# Patient Record
Sex: Male | Born: 1995 | Race: Black or African American | Hispanic: No | Marital: Single
Health system: Southern US, Community
[De-identification: ages and names within clinical notes are randomized; demographics above are authoritative.]

## PROBLEM LIST (undated history)

## (undated) DIAGNOSIS — F909 Attention-deficit hyperactivity disorder, unspecified type: Secondary | ICD-10-CM

## (undated) DIAGNOSIS — R51 Headache: Secondary | ICD-10-CM

## (undated) HISTORY — PX: MOUTH SURGERY: SHX715

## (undated) HISTORY — DX: Headache: R51

---
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
## 2016-02-05 ENCOUNTER — Emergency Department (HOSPITAL_COMMUNITY): Payer: Medicaid Other

## 2016-02-05 ENCOUNTER — Encounter (HOSPITAL_COMMUNITY): Payer: Self-pay

## 2016-02-05 ENCOUNTER — Emergency Department (HOSPITAL_COMMUNITY)
Admission: EM | Admit: 2016-02-05 | Discharge: 2016-02-05 | Disposition: A | Discharge status: Home / Self Care | Payer: Medicaid Other | Attending: Emergency Medicine | Admitting: Emergency Medicine

## 2016-02-05 DIAGNOSIS — S59901A Unspecified injury of right elbow, initial encounter: Secondary | ICD-10-CM | POA: Diagnosis present

## 2016-02-05 DIAGNOSIS — F909 Attention-deficit hyperactivity disorder, unspecified type: Secondary | ICD-10-CM | POA: Insufficient documentation

## 2016-02-05 DIAGNOSIS — S53401A Unspecified sprain of right elbow, initial encounter: Secondary | ICD-10-CM

## 2016-02-05 DIAGNOSIS — Z79899 Other long term (current) drug therapy: Secondary | ICD-10-CM | POA: Insufficient documentation

## 2016-02-05 DIAGNOSIS — J45909 Unspecified asthma, uncomplicated: Secondary | ICD-10-CM | POA: Insufficient documentation

## 2016-02-05 DIAGNOSIS — Y929 Unspecified place or not applicable: Secondary | ICD-10-CM | POA: Diagnosis not present

## 2016-02-05 DIAGNOSIS — Y999 Unspecified external cause status: Secondary | ICD-10-CM | POA: Diagnosis not present

## 2016-02-05 DIAGNOSIS — F17200 Nicotine dependence, unspecified, uncomplicated: Secondary | ICD-10-CM | POA: Diagnosis not present

## 2016-02-05 DIAGNOSIS — W19XXXA Unspecified fall, initial encounter: Secondary | ICD-10-CM | POA: Diagnosis not present

## 2016-02-05 DIAGNOSIS — Y9372 Activity, wrestling: Secondary | ICD-10-CM | POA: Insufficient documentation

## 2016-02-05 MED ORDER — IBUPROFEN 400 MG PO TABS: 600.0000 mg | ORAL_TABLET | Freq: Once | ORAL | Status: DC

## 2016-02-05 MED ORDER — IBUPROFEN 400 MG PO TABS: 400.0000 mg | ORAL_TABLET | Freq: Four times a day (QID) | ORAL | 0 refills | Status: DC | PRN

## 2016-02-05 NOTE — ED Notes (Signed)
Declined W/C at D/C and was escorted to lobby by RN. 

## 2016-02-05 NOTE — ED Provider Notes (Signed)
[facility]PT Provider Note   CSN: [id number] Arrival date & time: 02/05/16  1242  By signing my name below, I, [name], attest that this documentation has been prepared under the direction and in the presence of News CorporationSamantha Alix Lahmann PA-C . Electronically Signed: [name], Neurosurgeoncribe. 02/05/16. 2:04 PM.  History   Chief Complaint Chief Complaint  Patient presents with  . Arm Injury   The history is provided by the patient. No language interpreter was used.    HPI Comments: Samuel Farrell is a 20 y.o. male who presents to the Emergency Department complaining of unchanged right elbow pain that began 3 days ago after wrestling with a family member. He states this individual fell on the affected area, causing him to have pain that is worse with certain movements. Pt has not tried taking anything for pain.    Past Medical History:  Diagnosis Date  . ADHD (attention deficit hyperactivity disorder)   . Asthma   . WGNFAOZH(086.5Headache(784.0)     Patient Active Problem List   Diagnosis Date Noted  . 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 Episodic tension type headache 06/30/2013  . Migraine with aura, without mention of intractable migraine without mention of status migrainosus 06/30/2013  . Migraine without aura, without mention of intractable migraine without mention of status migrainosus 06/30/2013  . Attention deficit disorder with hyperactivity(314.01) 06/30/2013    Past Surgical History:  Procedure Laterality Date  . MOUTH SURGERY         Home Medications    Prior to Admission medications   Medication Sig Start Date End Date Taking? Authorizing Provider  albuterol (PROVENTIL HFA;VENTOLIN HFA) 108 (90 BASE) MCG/ACT inhaler Inhale 1-2 puffs into the lungs every 6 (six) hours as needed for wheezing or shortness of breath. 12/19/14   Zadie Rhineonald Wickline, MD  ibuprofen (ADVIL,MOTRIN) 600 MG tablet Take 1 tablet (600 mg total) by mouth every 6 (six) hours  as needed for mild pain or moderate pain. Patient not taking: Reported on 07/28/2015 01/23/15   Lowanda FosterMindy Brewer, NP  methylphenidate (METADATE CD) 30 MG CR capsule Take 30 mg by mouth daily as needed (for concentration).    Historical Provider, MD  mirtazapine (REMERON) 7.5 MG tablet Take 7.5 mg by mouth at bedtime as needed (sleep).    Historical Provider, MD    Family History Family History  Problem Relation Age of Onset  . Migraines Mother 6210  . ADD / ADHD Mother   . Learning disabilities Mother   . ADD / ADHD Father   . Learning disabilities Father   . ADD / ADHD Sister   . Learning disabilities Sister   . Migraines Maternal Grandmother     Adult  . Other Maternal Grandmother     Organ Failure died at 9561  . Pancreatic cancer Maternal Grandfather     Died at 351  . Migraines Paternal Grandfather     Adult Onset  . Dementia Paternal Grandfather     Age at time of death unknown  . Cancer Paternal Grandmother     Age at time of death unknown    Social History Social History  Substance Use Topics  . Smoking status: Light Tobacco Smoker  . Smokeless tobacco: Never Used  . Alcohol use No     Allergies   Penicillins   Review of Systems Review of Systems  Musculoskeletal: Positive for arthralgias. Negative for joint swelling.  Neurological: Negative for weakness and numbness.     Physical Exam  Updated Vital Signs BP 110/65 (BP Location: Right Arm)   Pulse 65   Temp 97.4 F (36.3 C) (Oral)   Resp 14   SpO2 98%   Physical Exam  Constitutional: He is oriented to person, place, and time. He appears well-developed and well-nourished. No distress.  HENT:  Head: Normocephalic and atraumatic.  Eyes: Conjunctivae are normal. Right eye exhibits no discharge. Left eye exhibits no discharge. No scleral icterus.  Cardiovascular: Normal rate.   Pulmonary/Chest: Effort normal.  Musculoskeletal: He exhibits tenderness. He exhibits no deformity.  Right: Tenderness to palpation  over right olecranon, tricep, and distal ulna. Increased pain with extension of elbow. No obvious bony deformity or ecchymosis.   Neurological: He is alert and oriented to person, place, and time. Coordination normal.  Skin: Skin is warm and dry. No rash noted. He is not diaphoretic. No erythema. No pallor.  Psychiatric: He has a normal mood and affect. His behavior is normal.  Nursing note and vitals reviewed.    ED Treatments / Results  DIAGNOSTIC STUDIES: Oxygen Saturation is 98% on RA, normal by my interpretation.    COORDINATION OF CARE: 2:04 PM Discussed treatment plan with pt at bedside and pt agreed to plan.   Labs (all labs ordered are listed, but only abnormal results are displayed) Labs Reviewed - No data to display  EKG  EKG Interpretation None       Radiology No results found.  Procedures Procedures (including critical care time)  Medications Ordered in ED Medications - No data to display   Initial Impression / Assessment and Plan / ED Course  I have reviewed the triage vital signs and the nursing notes.  Pertinent labs & imaging results that were available during my care of the patient were reviewed by me and considered in my medical decision making (see chart for details).  Clinical Course    Patient X-Ray negative for obvious fracture or dislocation.  Pt advised to follow up with orthopedics. Patient given sling while in ED, conservative therapy recommended and discussed. Patient will be discharged home & is agreeable with above plan. Returns precautions discussed. Pt appears safe for discharge.   Final Clinical Impressions(s) / ED Diagnoses   Final diagnoses:  Sprain of right elbow, initial encounter    New Prescriptions New Prescriptions   IBUPROFEN (ADVIL,MOTRIN) 400 MG TABLET    Take 1 tablet (400 mg total) by mouth every 6 (six) hours as needed.   I personally performed the services described in this documentation, which was scribed in my  presence. The recorded information has been reviewed and is accurate.      Lester KinsmanSamantha Tripp [place], PA-C 02/05/16 [id number]    Pricilla LovelessScott Goldston, MD 02/14/16 [id number]

## 2016-02-05 NOTE — Discharge Instructions (Signed)
Apply ice to affected area. Take ibuprofen as needed for pain. Follow up with your primary care provider or orthopedic provider if symptoms do not improve.

## 2016-02-05 NOTE — ED Triage Notes (Signed)
Patient here with right arm/elbow pain after wrestling with family member on Friday. Pain with ROM. No obvious deformity, NAD

## 2016-02-28 ENCOUNTER — Encounter (HOSPITAL_COMMUNITY): Payer: Self-pay | Admitting: Physical Medicine and Rehabilitation

## 2016-02-28 ENCOUNTER — Emergency Department (HOSPITAL_COMMUNITY): Payer: Medicaid Other

## 2016-02-28 ENCOUNTER — Emergency Department (HOSPITAL_COMMUNITY)
Admission: EM | Admit: 2016-02-28 | Discharge: 2016-02-28 | Disposition: A | Discharge status: Home / Self Care | Payer: Medicaid Other | Attending: Physician Assistant | Admitting: Physician Assistant

## 2016-02-28 DIAGNOSIS — F909 Attention-deficit hyperactivity disorder, unspecified type: Secondary | ICD-10-CM | POA: Diagnosis not present

## 2016-02-28 DIAGNOSIS — L02416 Cutaneous abscess of left lower limb: Secondary | ICD-10-CM | POA: Diagnosis not present

## 2016-02-28 DIAGNOSIS — B999 Unspecified infectious disease: Secondary | ICD-10-CM

## 2016-02-28 DIAGNOSIS — M7989 Other specified soft tissue disorders: Secondary | ICD-10-CM | POA: Diagnosis present

## 2016-02-28 DIAGNOSIS — J45909 Unspecified asthma, uncomplicated: Secondary | ICD-10-CM | POA: Diagnosis not present

## 2016-02-28 DIAGNOSIS — F17200 Nicotine dependence, unspecified, uncomplicated: Secondary | ICD-10-CM | POA: Insufficient documentation

## 2016-02-28 DIAGNOSIS — L0291 Cutaneous abscess, unspecified: Secondary | ICD-10-CM

## 2016-02-28 MED ORDER — NAPROXEN 250 MG PO TABS: 250.0000 mg | ORAL_TABLET | Freq: Two times a day (BID) | ORAL | 0 refills | Status: DC

## 2016-02-28 MED ORDER — LIDOCAINE HCL (PF) 1 % IJ SOLN
INTRAMUSCULAR | Status: AC
  Filled 2016-02-28: qty 5

## 2016-02-28 MED ORDER — LIDOCAINE HCL (PF) 1 % IJ SOLN
5.0000 mL | Freq: Once | INTRAMUSCULAR | Status: AC
Start: 2016-02-28 — End: 2016-02-28
  Administered 2016-02-28: 5 mL

## 2016-02-28 MED ORDER — SULFAMETHOXAZOLE-TRIMETHOPRIM 800-160 MG PO TABS: 1.0000 | ORAL_TABLET | Freq: Two times a day (BID) | ORAL | 0 refills | Status: AC

## 2016-02-28 NOTE — ED Provider Notes (Signed)
[facility] Provider Note   CSN: [id number] Arrival date & time: 02/28/16  1125   By signing my name below, I, Freida Busmaniana Omoyeni, attest that this documentation has been prepared under the direction and in the presence of Everlene FarrierWilliam Gayle Martinez, PA-C. Electronically Signed: Freida Busmaniana Omoyeni, Scribe. 02/28/2016. 1:17 PM.   History   Chief Complaint Chief Complaint  Patient presents with  . Wound Check      The history is provided by the patient. No language interpreter was used.    HPI Comments:  Samuel RushingKhalil Wieland is a 20 y.o. male who presents to the Emergency Department for a wound check. Pt states he noticed a localized area of swelling  below the left knee 1 week ago. He states he popped it and it drained purulent fluid and has remained open and slightly draining for a few days. He reports associated mild-moderate pain to the site.  He denies pain with ROM of his knee. He denies pain to his knee joint. He denies fever; numbness, tingling, and weakness.    Past Medical History:  Diagnosis Date  . ADHD (attention deficit hyperactivity disorder)   . Asthma   . FAOZHYQM(578.4Headache(784.0)     Patient Active Problem List   Diagnosis Date Noted  . 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 Episodic tension type headache 06/30/2013  . Migraine with aura, without mention of intractable migraine without mention of status migrainosus 06/30/2013  . Migraine without aura, without mention of intractable migraine without mention of status migrainosus 06/30/2013  . Attention deficit disorder with hyperactivity(314.01) 06/30/2013    Past Surgical History:  Procedure Laterality Date  . MOUTH SURGERY         Home Medications    Prior to Admission medications   Medication Sig Start Date End Date Taking? Authorizing Provider  albuterol (PROVENTIL HFA;VENTOLIN HFA) 108 (90 BASE) MCG/ACT inhaler Inhale 1-2 puffs into the lungs every 6 (six) hours as needed for wheezing or  shortness of breath. 12/19/14   Zadie Rhineonald Wickline, MD  ibuprofen (ADVIL,MOTRIN) 400 MG tablet Take 1 tablet (400 mg total) by mouth every 6 (six) hours as needed. 02/05/16   Samantha Tripp Dowless, PA-C  ibuprofen (ADVIL,MOTRIN) 600 MG tablet Take 1 tablet (600 mg total) by mouth every 6 (six) hours as needed for mild pain or moderate pain. Patient not taking: Reported on 07/28/2015 01/23/15   Lowanda FosterMindy Brewer, NP  methylphenidate (METADATE CD) 30 MG CR capsule Take 30 mg by mouth daily as needed (for concentration).    Historical Provider, MD  mirtazapine (REMERON) 7.5 MG tablet Take 7.5 mg by mouth at bedtime as needed (sleep).    Historical Provider, MD  naproxen (NAPROSYN) 250 MG tablet Take 1 tablet (250 mg total) by mouth 2 (two) times daily with a meal. 02/28/16   Everlene FarrierWilliam Velisa Regnier, PA-C  sulfamethoxazole-trimethoprim (BACTRIM DS,SEPTRA DS) 800-160 MG tablet Take 1 tablet by mouth 2 (two) times daily. 02/28/16 03/06/16  Everlene FarrierWilliam Eula Jaster, PA-C    Family History Family History  Problem Relation Age of Onset  . Migraines Mother 7310  . ADD / ADHD Mother   . Learning disabilities Mother   . ADD / ADHD Father   . Learning disabilities Father   . ADD / ADHD Sister   . Learning disabilities Sister   . Migraines Maternal Grandmother     Adult  . Other Maternal Grandmother     Organ Failure died at 6561  . Pancreatic cancer Maternal Grandfather     Died  at 47  . Migraines Paternal Grandfather     Adult Onset  . Dementia Paternal Grandfather     Age at time of death unknown  . Cancer Paternal Grandmother     Age at time of death unknown    Social History Social History  Substance Use Topics  . Smoking status: Light Tobacco Smoker  . Smokeless tobacco: Never Used  . Alcohol use No     Allergies   Penicillins   Review of Systems Review of Systems  Constitutional: Negative for fever.  Skin: Positive for wound.  Neurological: Negative for weakness and numbness.     Physical  Exam Updated Vital Signs BP 124/68 (BP Location: Right Arm)   Pulse 64   Temp 97.7 F (36.5 C) (Oral)   Resp 18   Ht 5\' 11"  (1.803 m)   Wt 68 kg   SpO2 99%   BMI 20.92 kg/m   Physical Exam  Constitutional: He appears well-developed and well-nourished. No distress.  Nontoxic appearing.  HENT:  Head: Normocephalic and atraumatic.  Eyes: Right eye exhibits no discharge. Left eye exhibits no discharge.  Cardiovascular: Normal rate, regular rhythm and intact distal pulses.   Bilateral DP and PT pulses intact   Pulmonary/Chest: Effort normal. No respiratory distress.  Musculoskeletal: Normal range of motion. He exhibits no edema or deformity.  Good ROM of his left knee without pain or difficulty. No right knee joint effusion.   Neurological: He is alert. No sensory deficit. Coordination normal.  Skin: Skin is warm and dry. Capillary refill takes less than 2 seconds. No rash noted. He is not diaphoretic. There is erythema. No pallor.  Small 3cm erythematous area of induration with purulent drainage distal to left knee joint. There is overlying erythema, but no surrounding erythema.   Psychiatric: He has a normal mood and affect. His behavior is normal.  Nursing note and vitals reviewed.    ED Treatments / Results  DIAGNOSTIC STUDIES:  Oxygen Saturation is 99% on RA, normal by my interpretation.    COORDINATION OF CARE:  12:45 PM Discussed treatment plan with pt at bedside and pt agreed to plan.  Labs (all labs ordered are listed, but only abnormal results are displayed) Labs Reviewed - No data to display  EKG  EKG Interpretation None       Radiology Dg Knee 1-2 Views Left  Result Date: 02/28/2016 CLINICAL DATA:  Skin sore and pain involving the left tibial tuberosity for the past week. EXAM: LEFT KNEE - 1-2 VIEW COMPARISON:  None. FINDINGS: No fracture or dislocation. Joint spaces are preserved. No evidence of chondrocalcinosis. No joint effusion. Regional soft  tissues appear normal with special attention paid to the tibia tuberosity. No radiopaque foreign body. No subcutaneous emphysema. IMPRESSION: No radiographic correlate for patient's area of concern. Electronically Signed   By: Simonne Come M.D.   On: 02/28/2016 12:24    Procedures .Marland KitchenIncision and Drainage Date/Time: 02/28/2016 12:55 PM Performed by: Everlene Farrier Authorized by: Everlene Farrier   Consent:    Consent obtained:  Verbal   Consent given by:  Patient   Risks discussed:  Incomplete drainage, pain and infection Location:    Type:  Abscess   Location:  Lower extremity   Lower extremity location:  Leg   Leg location:  L lower leg Pre-procedure details:    Skin preparation:  Betadine Anesthesia (see MAR for exact dosages):    Anesthesia method:  Local infiltration   Local anesthetic:  Lidocaine 1%  WITH epi Procedure type:    Complexity:  Simple Procedure details:    Incision types:  Single straight   Incision depth:  Dermal   Scalpel blade:  11   Wound management:  Irrigated with saline and probed and deloculated   Drainage:  Bloody and purulent   Drainage amount:  Scant   Wound treatment:  Wound left open   Packing materials:  None Post-procedure details:    Patient tolerance of procedure:  Tolerated well, no immediate complications    (including critical care time)  EMERGENCY DEPARTMENT US SOFT TISSUE INTERPRETATION "Study: Limited Ultrasound of the noted body part in comments below"  INDICATIONS: Soft tissue infection Multiple views of the body part are obtained with a multi-frequency linear probe  PERFORMED BY:  Myself Everlene Farrier, PA-C  IMAGES ARCHIVED?: Yes  BODY PART:Lower extremity  FINDINGS: Abcess present  INTERPRETATION:  Abcess present  COMMENT: small abscess.      Medications Ordered in ED Medications  lidocaine (PF) (XYLOCAINE) 1 % injection (not administered)  lidocaine (PF) (XYLOCAINE) 1 % injection 5 mL (5 mLs Infiltration  Given 02/28/16 1316)     Initial Impression / Assessment and Plan / ED Course  I have reviewed the triage vital signs and the nursing notes.  Pertinent labs & imaging results that were available during my care of the patient were reviewed by me and considered in my medical decision making (see chart for details).  Clinical Course      This is a 20 y.o. male who presents to the Emergency Department for a wound check. Pt states he noticed a localized area of swelling  below the left knee 1 week ago. He states he popped it and it drained purulent fluid and has remained open and slightly draining for a few days. He reports associated mild-moderate pain to the site.  He denies pain with ROM of his knee. He denies pain to his knee joint. He denies fevers.  On exam the patient is afebrile and nontoxic appearing. His a small area of induration and erythema distal to his left knee joint. No left knee joint effusion or pain with range of motion. There is some slight purulent drainage from the site. There is overlying erythema without surrounding erythema. X-ray obtained in triage is unremarkable.  Patient with skin abscess on exam and ultrasound.  Incision and drainage performed in the ED today.  Abscess was not large enough to warrant packing or drain placement. Supportive care and return precautions discussed.  Pt sent home with Bactrim as a precaution, as it is close to his knee joint.  I discussed strict and specific return precautions. I advised the patient to follow-up with their primary care provider this week. I advised the patient to return to the emergency department with new or worsening symptoms or new concerns. The patient verbalized understanding and agreement with plan.      Final Clinical Impressions(s) / ED Diagnoses   Final diagnoses:  Abscess    New Prescriptions New Prescriptions   NAPROXEN (NAPROSYN) 250 MG TABLET    Take 1 tablet (250 mg total) by mouth 2 (two) times daily with  a meal.   SULFAMETHOXAZOLE-TRIMETHOPRIM (BACTRIM DS,SEPTRA DS) 800-160 MG TABLET    Take 1 tablet by mouth 2 (two) times daily.   I personally performed the services described in this documentation, which was scribed in my presence. The recorded information has been reviewed and is accurate.       Everlene Farrier, PA-C  02/28/16 1318    Courteney Lyn Mackuen, MD 02/28/16 1542

## 2016-02-28 NOTE — ED Triage Notes (Signed)
Pt reports possible cyst/abscess to L knee. Ongoing for several days. Now reports increased pain this morning.

## 2016-02-28 NOTE — ED Notes (Signed)
Patient transported to X-ray 

## 2016-02-28 NOTE — ED Notes (Addendum)
Bump to left knee since Thursday, just came up , denies injury and pt states it swelled to look like he had second knee cap and he mashed it on Sunday , states it drained a lot  now having pain behind knee cap and both sides of knee, pt is jumpy when I pressed on sides of knee. Pt is able to walk

## 2016-04-03 ENCOUNTER — Encounter (HOSPITAL_COMMUNITY): Payer: Self-pay | Admitting: Emergency Medicine

## 2016-04-03 ENCOUNTER — Emergency Department (HOSPITAL_COMMUNITY)
Admission: EM | Admit: 2016-04-03 | Discharge: 2016-04-03 | Disposition: A | Discharge status: Home / Self Care | Payer: Medicaid Other | Attending: Emergency Medicine | Admitting: Emergency Medicine

## 2016-04-03 DIAGNOSIS — R369 Urethral discharge, unspecified: Secondary | ICD-10-CM | POA: Diagnosis present

## 2016-04-03 DIAGNOSIS — F17200 Nicotine dependence, unspecified, uncomplicated: Secondary | ICD-10-CM | POA: Insufficient documentation

## 2016-04-03 DIAGNOSIS — F909 Attention-deficit hyperactivity disorder, unspecified type: Secondary | ICD-10-CM | POA: Insufficient documentation

## 2016-04-03 DIAGNOSIS — J45909 Unspecified asthma, uncomplicated: Secondary | ICD-10-CM | POA: Insufficient documentation

## 2016-04-03 LAB — URINALYSIS, ROUTINE W REFLEX MICROSCOPIC
Bilirubin Urine: NEGATIVE
GLUCOSE, UA: NEGATIVE mg/dL
Hgb urine dipstick: NEGATIVE
KETONES UR: NEGATIVE mg/dL
Nitrite: NEGATIVE
PH: 7 (ref 5.0–8.0)
Protein, ur: NEGATIVE mg/dL
SPECIFIC GRAVITY, URINE: 1.011 (ref 1.005–1.030)
SQUAMOUS EPITHELIAL / LPF: NONE SEEN

## 2016-04-03 MED ORDER — AZITHROMYCIN 250 MG PO TABS
1000.0000 mg | ORAL_TABLET | Freq: Once | ORAL | Status: AC
  Administered 2016-04-03: 1000 mg via ORAL
  Filled 2016-04-03: qty 4

## 2016-04-03 MED ORDER — ONDANSETRON 4 MG PO TBDP
4.0000 mg | ORAL_TABLET | Freq: Once | ORAL | Status: AC
  Administered 2016-04-03: 4 mg via ORAL
  Filled 2016-04-03: qty 1

## 2016-04-03 MED ORDER — CEFTRIAXONE SODIUM 250 MG IJ SOLR
250.0000 mg | Freq: Once | INTRAMUSCULAR | Status: AC
  Administered 2016-04-03: 250 mg via INTRAMUSCULAR
  Filled 2016-04-03: qty 250

## 2016-04-03 NOTE — ED Provider Notes (Signed)
[facility] Provider Note    By signing my name below, I, Earmon PhoenixJennifer Waddell, attest that this documentation has been prepared under the direction and in the presence of Sharilyn SitesLisa Welcome Fults, PA-C. Electronically Signed: Earmon PhoenixJennifer Waddell, ED Scribe. 04/03/16. 2:50 PM.   History   Chief Complaint Chief Complaint  Patient presents with  . SEXUALLY TRANSMITTED DISEASE  . Penile Discharge    The history is provided by the patient and medical records. No language interpreter was used.    HPI Comments:  Samuel Farrell is a 20 y.o. male who presents to the Emergency Department complaining of yellow penile discharge that he noticed about two weeks ago. He reports having unprotected sex with a new sexual partner recently. He has not done anything for treatment. He denies modifying factors. He denies fever, chills, nausea, vomiting, abdominal pain, dysuria, genital lesions, penile or scrotal pain or swelling. He reports allergies to PCN.  No hx of STD in the past.  Past Medical History:  Diagnosis Date  . ADHD (attention deficit hyperactivity disorder)   . Asthma   . UEAVWUJW(119.1Headache(784.0)     Patient Active Problem List   Diagnosis Date Noted  . 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 Episodic tension type headache 06/30/2013  . Migraine with aura, without mention of intractable migraine without mention of status migrainosus 06/30/2013  . Migraine without aura, without mention of intractable migraine without mention of status migrainosus 06/30/2013  . Attention deficit disorder with hyperactivity(314.01) 06/30/2013    Past Surgical History:  Procedure Laterality Date  . MOUTH SURGERY         Home Medications    Prior to Admission medications   Medication Sig Start Date End Date Taking? Authorizing Provider  albuterol (PROVENTIL HFA;VENTOLIN HFA) 108 (90 BASE) MCG/ACT inhaler Inhale 1-2 puffs into the lungs every 6 (six) hours as needed for wheezing or  shortness of breath. 12/19/14   Zadie Rhineonald Wickline, MD  ibuprofen (ADVIL,MOTRIN) 400 MG tablet Take 1 tablet (400 mg total) by mouth every 6 (six) hours as needed. 02/05/16   Samantha Tripp Dowless, PA-C  ibuprofen (ADVIL,MOTRIN) 600 MG tablet Take 1 tablet (600 mg total) by mouth every 6 (six) hours as needed for mild pain or moderate pain. Patient not taking: Reported on 07/28/2015 01/23/15   Lowanda FosterMindy Brewer, NP  methylphenidate (METADATE CD) 30 MG CR capsule Take 30 mg by mouth daily as needed (for concentration).    Historical Provider, MD  mirtazapine (REMERON) 7.5 MG tablet Take 7.5 mg by mouth at bedtime as needed (sleep).    Historical Provider, MD  naproxen (NAPROSYN) 250 MG tablet Take 1 tablet (250 mg total) by mouth 2 (two) times daily with a meal. 02/28/16   Everlene FarrierWilliam Dansie, PA-C    Family History Family History  Problem Relation Age of Onset  . Migraines Mother 6210  . ADD / ADHD Mother   . Learning disabilities Mother   . ADD / ADHD Father   . Learning disabilities Father   . ADD / ADHD Sister   . Learning disabilities Sister   . Migraines Maternal Grandmother     Adult  . Other Maternal Grandmother     Organ Failure died at 2361  . Pancreatic cancer Maternal Grandfather     Died at 7551  . Migraines Paternal Grandfather     Adult Onset  . Dementia Paternal Grandfather     Age at time of death unknown  . Cancer Paternal Grandmother  Age at time of death unknown    Social History Social History  Substance Use Topics  . Smoking status: Light Tobacco Smoker  . Smokeless tobacco: Never Used  . Alcohol use No     Allergies   Penicillins   Review of Systems Review of Systems  Constitutional: Negative for chills and fever.  Gastrointestinal: Negative for abdominal pain, nausea and vomiting.  Genitourinary: Positive for discharge. Negative for dysuria, genital sores, penile pain, penile swelling, scrotal swelling and testicular pain.  All other systems reviewed and are  negative.    Physical Exam Updated Vital Signs BP 128/80 (BP Location: Right Arm)   Pulse 80   Temp 98.1 F (36.7 C) (Oral)   Resp 17   Ht 5\' 10"  (1.778 m)   Wt 150 lb (68 kg)   SpO2 100%   BMI 21.52 kg/m   Physical Exam  Constitutional: He is oriented to person, place, and time. He appears well-developed and well-nourished.  HENT:  Head: Normocephalic and atraumatic.  Mouth/Throat: Oropharynx is clear and moist.  Eyes: Conjunctivae and EOM are normal. Pupils are equal, round, and reactive to light.  Neck: Normal range of motion.  Cardiovascular: Normal rate, regular rhythm and normal heart sounds.   Pulmonary/Chest: Effort normal and breath sounds normal.  Abdominal: Soft. Bowel sounds are normal.  Musculoskeletal: Normal range of motion.  Neurological: He is alert and oriented to person, place, and time.  Skin: Skin is warm and dry.  Psychiatric: He has a normal mood and affect.  Nursing note and vitals reviewed.    ED Treatments / Results  DIAGNOSTIC STUDIES: Oxygen Saturation is 100% on RA, normal by my interpretation.   COORDINATION OF CARE: 2:48 PM- Will check and treat for GC/Chlamydia. Pt verbalizes understanding and agrees to plan.  Medications - No data to display  Labs (all labs ordered are listed, but only abnormal results are displayed) Labs Reviewed - No data to display  EKG  EKG Interpretation None       Radiology No results found.  Procedures Procedures (including critical care time)  Medications Ordered in ED Medications - No data to display   Initial Impression / Assessment and Plan / ED Course  I have reviewed the triage vital signs and the nursing notes.  Pertinent labs & imaging results that were available during my care of the patient were reviewed by me and considered in my medical decision making (see chart for details).  Clinical Course    20 year old male here with penile discharge. Reports new sexual partner. No  history of STD in the past. Denies any abdominal or testicle pain. He is afebrile and nontoxic. UA with small leuks, rare bacteria.  Gc/chl pending.  Patient was treated empirically with rocephin and azithromycin.  Advised he will be notified if culture results are positive.  Recommended to follow-up with the health dept for STD screenings in the future.  Discussed plan with patient, he acknowledged understanding and agreed with plan of care.  Return precautions given for new or worsening symptoms.  Final Clinical Impressions(s) / ED Diagnoses   Final diagnoses:  Penile discharge    New Prescriptions New Prescriptions   No medications on file   I personally performed the services described in this documentation, which was scribed in my presence. The recorded information has been reviewed and is accurate.   Garlon HatchetLisa M Addylin Manke, PA-C 04/03/16 1609    Rolland PorterMark James, MD 04/11/16 [phone]

## 2016-04-03 NOTE — ED Triage Notes (Signed)
Pt. Stated, I've had a penile discharge for 2 weeks.

## 2016-04-03 NOTE — Discharge Instructions (Signed)
You have been treated for gonorrhea and chlamydia. We will call you if your culture results are positive. Please follow-up with the health dept for STD checks in the future. Return to the ED for new or worsening symptoms.

## 2016-04-04 LAB — GC/CHLAMYDIA PROBE AMP (~~LOC~~) NOT AT ARMC
CHLAMYDIA, DNA PROBE: POSITIVE — AB
NEISSERIA GONORRHEA: NEGATIVE

## 2016-11-05 ENCOUNTER — Encounter (HOSPITAL_COMMUNITY): Payer: Self-pay | Admitting: Nurse Practitioner

## 2016-11-05 ENCOUNTER — Emergency Department (HOSPITAL_COMMUNITY): Payer: Medicaid Other

## 2016-11-05 ENCOUNTER — Emergency Department (HOSPITAL_COMMUNITY)
Admission: EM | Admit: 2016-11-05 | Discharge: 2016-11-05 | Disposition: A | Discharge status: Home / Self Care | Payer: Medicaid Other | Attending: Emergency Medicine | Admitting: Emergency Medicine

## 2016-11-05 DIAGNOSIS — M25562 Pain in left knee: Secondary | ICD-10-CM | POA: Insufficient documentation

## 2016-11-05 DIAGNOSIS — Z5321 Procedure and treatment not carried out due to patient leaving prior to being seen by health care provider: Secondary | ICD-10-CM | POA: Insufficient documentation

## 2016-11-05 NOTE — ED Notes (Signed)
Called pt x2 to reassess vitals, pt did not answer

## 2016-11-05 NOTE — ED Triage Notes (Signed)
Pt endorses left knee pain x 4 years that is aggravated by activity. Pt sts yesterday his knee gave out while he was walking yesterday. Pt was supposed to go to work today but was concerned about his knee so he wanted to get it checked out. Pt sts he hears a grinding noise in his left knee.

## 2016-11-05 NOTE — ED Notes (Signed)
Called for patient for vitals reassess, no answer.  Will d/c

## 2016-11-05 NOTE — ED Notes (Signed)
Called pt twice no answer.

## 2017-04-30 ENCOUNTER — Emergency Department (HOSPITAL_COMMUNITY): Payer: No Typology Code available for payment source

## 2017-04-30 ENCOUNTER — Other Ambulatory Visit: Payer: Self-pay

## 2017-04-30 ENCOUNTER — Encounter (HOSPITAL_COMMUNITY): Payer: Self-pay | Admitting: Emergency Medicine

## 2017-04-30 ENCOUNTER — Emergency Department (HOSPITAL_COMMUNITY)
Admission: EM | Admit: 2017-04-30 | Discharge: 2017-04-30 | Disposition: A | Discharge status: Home / Self Care | Payer: No Typology Code available for payment source | Attending: Emergency Medicine | Admitting: Emergency Medicine

## 2017-04-30 DIAGNOSIS — Y929 Unspecified place or not applicable: Secondary | ICD-10-CM | POA: Diagnosis not present

## 2017-04-30 DIAGNOSIS — Z79899 Other long term (current) drug therapy: Secondary | ICD-10-CM | POA: Insufficient documentation

## 2017-04-30 DIAGNOSIS — F17200 Nicotine dependence, unspecified, uncomplicated: Secondary | ICD-10-CM | POA: Diagnosis not present

## 2017-04-30 DIAGNOSIS — M25512 Pain in left shoulder: Secondary | ICD-10-CM | POA: Insufficient documentation

## 2017-04-30 DIAGNOSIS — Y939 Activity, unspecified: Secondary | ICD-10-CM | POA: Diagnosis not present

## 2017-04-30 DIAGNOSIS — Y999 Unspecified external cause status: Secondary | ICD-10-CM | POA: Diagnosis not present

## 2017-04-30 DIAGNOSIS — R51 Headache: Secondary | ICD-10-CM | POA: Insufficient documentation

## 2017-04-30 DIAGNOSIS — J45909 Unspecified asthma, uncomplicated: Secondary | ICD-10-CM | POA: Insufficient documentation

## 2017-04-30 MED ORDER — METHOCARBAMOL 500 MG PO TABS: 500.0000 mg | ORAL_TABLET | Freq: Two times a day (BID) | ORAL | 0 refills | Status: DC

## 2017-04-30 MED ORDER — ACETAMINOPHEN 500 MG PO TABS: 500.0000 mg | ORAL_TABLET | Freq: Four times a day (QID) | ORAL | 0 refills | Status: DC | PRN

## 2017-04-30 MED ORDER — IBUPROFEN 600 MG PO TABS: 600.0000 mg | ORAL_TABLET | Freq: Four times a day (QID) | ORAL | 0 refills | Status: DC | PRN

## 2017-04-30 NOTE — ED Notes (Signed)
Ice pack applied.
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
## 2017-11-01 ENCOUNTER — Encounter (HOSPITAL_COMMUNITY): Payer: Self-pay | Admitting: *Deleted

## 2017-11-01 ENCOUNTER — Ambulatory Visit (HOSPITAL_COMMUNITY)
Admission: EM | Admit: 2017-11-01 | Discharge: 2017-11-01 | Disposition: A | Discharge status: Home / Self Care | Payer: Medicaid Other | Attending: Internal Medicine | Admitting: Internal Medicine

## 2017-11-01 ENCOUNTER — Other Ambulatory Visit: Payer: Self-pay

## 2017-11-01 ENCOUNTER — Ambulatory Visit (INDEPENDENT_AMBULATORY_CARE_PROVIDER_SITE_OTHER): Payer: Medicaid Other

## 2017-11-01 DIAGNOSIS — S93402A Sprain of unspecified ligament of left ankle, initial encounter: Secondary | ICD-10-CM | POA: Diagnosis not present

## 2017-11-01 DIAGNOSIS — M25572 Pain in left ankle and joints of left foot: Secondary | ICD-10-CM

## 2017-11-01 DIAGNOSIS — Y9367 Activity, basketball: Secondary | ICD-10-CM

## 2017-11-01 MED ORDER — IBUPROFEN 600 MG PO TABS: 600.0000 mg | ORAL_TABLET | Freq: Four times a day (QID) | ORAL | 0 refills | Status: DC | PRN

## 2017-11-01 NOTE — ED Triage Notes (Addendum)
Per pt he was playing basketball and he fell down hard on his left ankle. Per pt he needs a note for work

## 2017-11-01 NOTE — Discharge Instructions (Signed)
No fractures on x-ray, likely ankle sprain Use anti-inflammatories for pain/swelling. You may take up to 600-800 mg Ibuprofen every 8 hours with food. You may supplement Ibuprofen with Tylenol [phone] mg every 8 hours.  Ice and elevate foot multiple times a day Slowly transition back to weightbearing, ease back into physical activity  Follow-up if symptoms not improving as expected in the next 2 weeks, return if symptoms worsening.

## 2017-11-01 NOTE — ED Provider Notes (Signed)
[facility]    CSN: [id number] Arrival date & time: 11/01/17  1503     History   Chief Complaint Chief Complaint  Patient presents with  . Ankle Pain    HPI Samuel Farrell is a 22 y.o. male history of asthma, ADHD presenting today for evaluation of left ankle injury.  Patient was playing basketball yesterday and he twisted his ankle when landing on someone else's foot.  Since he has had pain with weightbearing and increased swelling.  He has not taken anything for his pain.  HPI  Past Medical History:  Diagnosis Date  . ADHD (attention deficit hyperactivity disorder)   . Asthma   . NFAOZHYQ(657.8Headache(784.0)     Patient Active Problem List   Diagnosis Date Noted  . 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 Episodic tension type headache 06/30/2013  . Migraine with aura, without mention of intractable migraine without mention of status migrainosus 06/30/2013  . Migraine without aura, without mention of intractable migraine without mention of status migrainosus 06/30/2013  . Attention deficit disorder with hyperactivity(314.01) 06/30/2013    Past Surgical History:  Procedure Laterality Date  . MOUTH SURGERY         Home Medications    Prior to Admission medications   Medication Sig Start Date End Date Taking? Authorizing Provider  acetaminophen (TYLENOL) 500 MG tablet Take 1 tablet (500 mg total) by mouth every 6 (six) hours as needed. 04/30/17   Law, Waylan BogaAlexandra M, PA-C  albuterol (PROVENTIL HFA;VENTOLIN HFA) 108 (90 BASE) MCG/ACT inhaler Inhale 1-2 puffs into the lungs every 6 (six) hours as needed for wheezing or shortness of breath. 12/19/14   Zadie RhineWickline, Donald, MD  ibuprofen (ADVIL,MOTRIN) 600 MG tablet Take 1 tablet (600 mg total) by mouth every 6 (six) hours as needed. 11/01/17   Wieters, Hallie C, PA-C  methocarbamol (ROBAXIN) 500 MG tablet Take 1 tablet (500 mg total) by mouth 2 (two) times daily. 04/30/17   Law, Waylan BogaAlexandra M, PA-C    methylphenidate (METADATE CD) 30 MG CR capsule Take 30 mg by mouth daily as needed (for concentration).    [provider]  mirtazapine (REMERON) 7.5 MG tablet Take 7.5 mg by mouth at bedtime as needed (sleep).    [provider]  naproxen (NAPROSYN) 250 MG tablet Take 1 tablet (250 mg total) by mouth 2 (two) times daily with a meal. 02/28/16   Everlene Farrieransie, William, PA-C    Family History Family History  Problem Relation Age of Onset  . Migraines Mother 7710  . ADD / ADHD Mother   . Learning disabilities Mother   . ADD / ADHD Father   . Learning disabilities Father   . ADD / ADHD Sister   . Learning disabilities Sister   . Migraines Maternal Grandmother        Adult  . Other Maternal Grandmother        Organ Failure died at 6361  . Pancreatic cancer Maternal Grandfather        Died at 10051  . Migraines Paternal Grandfather        Adult Onset  . Dementia Paternal Grandfather        Age at time of death unknown  . Cancer Paternal Grandmother        Age at time of death unknown    Social History Social History   Tobacco Use  . Smoking status: Heavy Tobacco Smoker    Packs/day: 0.50  . Smokeless tobacco: Never Used  Substance Use Topics  . Alcohol use: No  . Drug use: Yes    Frequency: 3.0 times per week    Types: Marijuana     Allergies   Penicillins   Review of Systems Review of Systems  Constitutional: Negative for fatigue and fever.  Eyes: Negative for redness, itching and visual disturbance.  Respiratory: Negative for shortness of breath.   Cardiovascular: Negative for chest pain and leg swelling.  Gastrointestinal: Negative for nausea and vomiting.  Musculoskeletal: Positive for arthralgias, gait problem, joint swelling and myalgias.  Skin: Negative for color change, rash and wound.  Neurological: Negative for dizziness, syncope, weakness, light-headedness and headaches.     Physical Exam Triage Vital Signs ED Triage Vitals  Enc Vitals Group      BP 11/01/17 1531 (!) 144/88     Pulse Rate 11/01/17 1531 (!) 55     Resp 11/01/17 1531 16     Temp 11/01/17 1531 98 F (36.7 C)     Temp Source 11/01/17 1531 Oral     SpO2 11/01/17 1531 100 %     Weight --      Height --      Head Circumference --      Peak Flow --      Pain Score 11/01/17 1528 10     Pain Loc --      Pain Edu? --      Excl. in GC? --    No data found.  Updated Vital Signs BP (!) 144/88 (BP Location: Left Arm)   Pulse (!) 55   Temp 98 F (36.7 C) (Oral)   Resp 16   SpO2 100%   Visual Acuity Right Eye Distance:   Left Eye Distance:   Bilateral Distance:    Right Eye Near:   Left Eye Near:    Bilateral Near:     Physical Exam  Constitutional: He is oriented to person, place, and time. He appears well-developed and well-nourished.  No acute distress  HENT:  Head: Normocephalic and atraumatic.  Nose: Nose normal.  Eyes: Conjunctivae are normal.  Neck: Neck supple.  Cardiovascular: Normal rate.  Pulmonary/Chest: Effort normal. No respiratory distress.  Abdominal: He exhibits no distension.  Musculoskeletal: Normal range of motion.  Moderate swelling to lateral malleolus of left ankle, tenderness to palpation overlying lateral malleolus extending to proximal metatarsals, nontender to palpation over medial malleolus.  Dorsalis pedis 2+, cap refill less than 2 seconds.  Neurological: He is alert and oriented to person, place, and time.  Skin: Skin is warm and dry.  Psychiatric: He has a normal mood and affect.  Nursing note and vitals reviewed.    UC Treatments / Results  Labs (all labs ordered are listed, but only abnormal results are displayed) Labs Reviewed - No data to display  EKG None  Radiology Dg Ankle Complete Left  Result Date: 11/01/2017 CLINICAL DATA:  Twisted left ankle playing basketball yesterday. Swelling and tenderness laterally EXAM: LEFT ANKLE COMPLETE - 3+ VIEW COMPARISON:  None. FINDINGS: Mild lateral soft tissue  swelling. No acute bony abnormality. Specifically, no fracture, subluxation, or dislocation. IMPRESSION: Negative. Electronically Signed   By: Charlett Nose M.D.   On: 11/01/2017 16:09    Procedures Procedures (including critical care time)  Medications Ordered in UC Medications - No data to display  Initial Impression / Assessment and Plan / UC Course  I have reviewed the triage vital signs and the nursing notes.  Pertinent labs & imaging results that  were available during my care of the patient were reviewed by me and considered in my medical decision making (see chart for details).    No fracture on x-ray, likely sprain.  Recommend conservative treatment with rest, ice, anti-inflammatories, elevation.  Ankle brace and crutches for support.  Weight-bear as tolerated.Discussed strict return precautions. Patient verbalized understanding and is agreeable with plan.   Final Clinical Impressions(s) / UC Diagnoses   Final diagnoses:  Sprain of left ankle, unspecified ligament, initial encounter     Discharge Instructions     No fractures on x-ray, likely ankle sprain Use anti-inflammatories for pain/swelling. You may take up to 600-800 mg Ibuprofen every 8 hours with food. You may supplement Ibuprofen with Tylenol [phone] mg every 8 hours.  Ice and elevate foot multiple times a day Slowly transition back to weightbearing, ease back into physical activity  Follow-up if symptoms not improving as expected in the next 2 weeks, return if symptoms worsening.    ED Prescriptions    Medication Sig Dispense Auth. Provider   ibuprofen (ADVIL,MOTRIN) 600 MG tablet Take 1 tablet (600 mg total) by mouth every 6 (six) hours as needed. 30 tablet Wieters, Bellmore C, PA-C     Controlled Substance Prescriptions [place] Controlled Substance Registry consulted? Not Applicable   Lew Dawes, [place] 11/01/17 1622

## 2018-02-17 ENCOUNTER — Other Ambulatory Visit: Payer: Self-pay

## 2018-02-17 ENCOUNTER — Emergency Department (HOSPITAL_COMMUNITY)
Admission: EM | Admit: 2018-02-17 | Discharge: 2018-02-17 | Disposition: A | Discharge status: Home / Self Care | Payer: Medicaid Other | Attending: Emergency Medicine | Admitting: Emergency Medicine

## 2018-02-17 ENCOUNTER — Encounter (HOSPITAL_COMMUNITY): Payer: Self-pay | Admitting: *Deleted

## 2018-02-17 DIAGNOSIS — Z79899 Other long term (current) drug therapy: Secondary | ICD-10-CM | POA: Diagnosis not present

## 2018-02-17 DIAGNOSIS — B9789 Other viral agents as the cause of diseases classified elsewhere: Secondary | ICD-10-CM

## 2018-02-17 DIAGNOSIS — F17200 Nicotine dependence, unspecified, uncomplicated: Secondary | ICD-10-CM | POA: Diagnosis not present

## 2018-02-17 DIAGNOSIS — R05 Cough: Secondary | ICD-10-CM | POA: Diagnosis present

## 2018-02-17 DIAGNOSIS — J45909 Unspecified asthma, uncomplicated: Secondary | ICD-10-CM | POA: Insufficient documentation

## 2018-02-17 DIAGNOSIS — J111 Influenza due to unidentified influenza virus with other respiratory manifestations: Secondary | ICD-10-CM | POA: Diagnosis not present

## 2018-02-17 DIAGNOSIS — R69 Illness, unspecified: Secondary | ICD-10-CM

## 2018-02-17 DIAGNOSIS — J069 Acute upper respiratory infection, unspecified: Secondary | ICD-10-CM

## 2018-02-17 MED ORDER — NAPROXEN 500 MG PO TABS: 500.0000 mg | ORAL_TABLET | Freq: Two times a day (BID) | ORAL | 0 refills | Status: DC

## 2018-02-17 MED ORDER — LIDOCAINE VISCOUS HCL 2 % MT SOLN: 15.0000 mL | OROMUCOSAL | 0 refills | Status: DC | PRN

## 2018-02-17 MED ORDER — ALBUTEROL SULFATE HFA 108 (90 BASE) MCG/ACT IN AERS: 1.0000 | INHALATION_SPRAY | Freq: Four times a day (QID) | RESPIRATORY_TRACT | 0 refills | Status: DC | PRN

## 2018-02-17 MED ORDER — FLUTICASONE PROPIONATE 50 MCG/ACT NA SUSP: 1.0000 | Freq: Every day | NASAL | 2 refills | Status: DC

## 2018-02-17 MED ORDER — BENZONATATE 100 MG PO CAPS: 200.0000 mg | ORAL_CAPSULE | Freq: Three times a day (TID) | ORAL | 0 refills | Status: DC

## 2018-02-17 NOTE — ED Provider Notes (Signed)
[facility]   CSN: [id number] Arrival date & time: 02/17/18  1613     History   Chief Complaint Chief Complaint  Patient presents with  . Cough    HPI Samuel Farrell is a 22 y.o. male with a past medical history of asthma, who presents to ED for evaluation of 5-day history of nonproductive cough, rhinorrhea, sinus pain and pressure, chills, sore throat.  Sick contacts including his relatives with similar symptoms over the weekend.  He has been taking TheraFlu, Mucinex with mild improvement in his symptoms.  He did not receive his influenza vaccine this year.  Denies any hemoptysis, fever, abdominal pain or vomiting, shortness of breath, wheezing.  States that his asthma has been stable since childhood however, will sometimes flareup when he has viral illness around this time of year.  HPI  Past Medical History:  Diagnosis Date  . ADHD (attention deficit hyperactivity disorder)   . Asthma   . [id number](478.2Headache(784.0)     Patient Active Problem List   Diagnosis Date Noted  . 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 Episodic tension type headache 06/30/2013  . Migraine with aura, without mention of intractable migraine without mention of status migrainosus 06/30/2013  . Migraine without aura, without mention of intractable migraine without mention of status migrainosus 06/30/2013  . Attention deficit disorder with hyperactivity(314.01) 06/30/2013    Past Surgical History:  Procedure Laterality Date  . MOUTH SURGERY          Home Medications    Prior to Admission medications   Medication Sig Start Date End Date Taking? Authorizing Provider  acetaminophen (TYLENOL) 500 MG tablet Take 1 tablet (500 mg total) by mouth every 6 (six) hours as needed. 04/30/17   Law, Waylan BogaAlexandra M, PA-C  albuterol (PROVENTIL HFA;VENTOLIN HFA) 108 (90 Base) MCG/ACT inhaler Inhale 1-2 puffs into the lungs every 6 (six) hours  as needed for wheezing or shortness of breath. 02/17/18   Quinlan Mcfall, PA-C  benzonatate (TESSALON) 100 MG capsule Take 2 capsules (200 mg total) by mouth every 8 (eight) hours. 02/17/18   Younique Casad, PA-C  fluticasone (FLONASE) 50 MCG/ACT nasal spray Place 1 spray into both nostrils daily. 02/17/18   Ladaija Dimino, PA-C  ibuprofen (ADVIL,MOTRIN) 600 MG tablet Take 1 tablet (600 mg total) by mouth every 6 (six) hours as needed. 11/01/17   Wieters, Hallie C, PA-C  lidocaine (XYLOCAINE) 2 % solution Use as directed 15 mLs in the mouth or throat as needed for mouth pain. 02/17/18   Maurice Fotheringham, PA-C  methocarbamol (ROBAXIN) 500 MG tablet Take 1 tablet (500 mg total) by mouth 2 (two) times daily. 04/30/17   Law, Waylan BogaAlexandra M, PA-C  methylphenidate (METADATE CD) 30 MG CR capsule Take 30 mg by mouth daily as needed (for concentration).    [provider]  mirtazapine (REMERON) 7.5 MG tablet Take 7.5 mg by mouth at bedtime as needed (sleep).    [provider]  naproxen (NAPROSYN) 500 MG tablet Take 1 tablet (500 mg total) by mouth 2 (two) times daily. 02/17/18   Dietrich PatesKhatri, Tyion Boylen, PA-C    Family History Family History  Problem Relation Age of Onset  . Migraines Mother 1510  . ADD / ADHD Mother   . Learning disabilities Mother   . ADD / ADHD Father   . Learning disabilities Father   . ADD / ADHD Sister   . Learning disabilities Sister   . Migraines Maternal Grandmother  Adult  . Other Maternal Grandmother        Organ Failure died at 45  . Pancreatic cancer Maternal Grandfather        Died at 37  . Migraines Paternal Grandfather        Adult Onset  . Dementia Paternal Grandfather        Age at time of death unknown  . Cancer Paternal Grandmother        Age at time of death unknown    Social History Social History   Tobacco Use  . Smoking status: Heavy Tobacco Smoker    Packs/day: 0.50  . Smokeless tobacco: Never Used  Substance Use Topics  . Alcohol use: No  .  Drug use: Yes    Frequency: 3.0 times per week    Types: Marijuana     Allergies   Penicillins   Review of Systems Review of Systems  Constitutional: Negative for chills and fever.  HENT: Positive for rhinorrhea, sinus pain and sore throat.   Respiratory: Positive for cough.      Physical Exam Updated Vital Signs BP (!) 142/90   Pulse 62   Temp (!) 97.5 F (36.4 C) (Oral)   Resp 18   Ht 5\' 11"  (1.803 m)   Wt 65.8 kg   SpO2 100%   BMI 20.22 kg/m   Physical Exam  Constitutional: He appears well-developed and well-nourished. No distress.  HENT:  Head: Normocephalic and atraumatic.  Right Ear: A middle ear effusion is present.  Left Ear: A middle ear effusion is present.  Nose: Right sinus exhibits maxillary sinus tenderness. Left sinus exhibits maxillary sinus tenderness.  Mouth/Throat: Uvula is midline and oropharynx is clear and moist. Tonsils are 1+ on the right. Tonsils are 1+ on the left. No tonsillar exudate.  Patient does not appear to be in acute distress. No trismus or drooling present. No pooling of secretions. Patient is tolerating secretions and is not in respiratory distress. No neck pain or tenderness to palpation of the neck. Full active and passive range of motion of the neck. No evidence of RPA or PTA.  Eyes: Conjunctivae and EOM are normal. No scleral icterus.  Neck: Normal range of motion.  Cardiovascular: Normal rate, regular rhythm and normal heart sounds.  Pulmonary/Chest: Effort normal and breath sounds normal. No respiratory distress.  No wheezing noted.  Neurological: He is alert.  Skin: No rash noted. He is not diaphoretic.  Psychiatric: He has a normal mood and affect.  Nursing note and vitals reviewed.    ED Treatments / Results  Labs (all labs ordered are listed, but only abnormal results are displayed) Labs Reviewed - No data to display  EKG None  Radiology No results found.  Procedures Procedures (including critical care  time)  Medications Ordered in ED Medications - No data to display   Initial Impression / Assessment and Plan / ED Course  I have reviewed the triage vital signs and the nursing notes.  Pertinent labs & imaging results that were available during my care of the patient were reviewed by me and considered in my medical decision making (see chart for details).     22 year old male presents to ED for dry cough, sore throat, rhinorrhea, sinus pain and pressure, for the past 5 days.  Sick contacts including relatives with similar symptoms.  He has tried Mucinex, TheraFlu with improvement in his symptoms.  States that he usually gets sick around this time of year.  Did not receive  his influenza vaccine because he states that "I do not know I was supposed to get it every year."  Denies any shortness of breath, hemoptysis or fever. On exam patient overall well-appearing.  Lungs are clear to auscultation bilaterally with no wheezing or increased work of breathing noted. Strep testing not indicated based on appearance of tonsils,  Centor criteria. Pt is tolerating secretions, not in respiratory distress, no neck pain, no trismus. Presentation not concerning for peritonsillar abscess or spread of infection to deep spaces of the throat; patent airway. Pt will be discharged with supportive treatment for viral URI and pharyngitis.  He is out of the window for Tamiflu administration.  Ibuprofen or Tylenol as needed for pain/fever.   Patient is hemodynamically stable, in NAD, and able to ambulate in the ED. Evaluation does not show pathology that would require ongoing emergent intervention or inpatient treatment. I explained the diagnosis to the patient. Pain has been managed and has no complaints prior to discharge. Patient is comfortable with above plan and is stable for discharge at this time. All questions were answered prior to disposition. Strict return precautions for returning to the ED were discussed.  Encouraged follow up with PCP.    Portions of this note were generated with Scientist, clinical (histocompatibility and immunogenetics). Dictation errors may occur despite best attempts at proofreading.   Final Clinical Impressions(s) / ED Diagnoses   Final diagnoses:  Viral URI with cough  Influenza-like illness    ED Discharge Orders         Ordered    albuterol (PROVENTIL HFA;VENTOLIN HFA) 108 (90 Base) MCG/ACT inhaler  Every 6 hours PRN     02/17/18 1626    fluticasone (FLONASE) 50 MCG/ACT nasal spray  Daily     02/17/18 1626    benzonatate (TESSALON) 100 MG capsule  Every 8 hours     02/17/18 1626    lidocaine (XYLOCAINE) 2 % solution  As needed     02/17/18 1626    naproxen (NAPROSYN) 500 MG tablet  2 times daily     02/17/18 1626           Dietrich Pates, PA-C 02/17/18 1631    Margarita Grizzle, MD 02/17/18 1902

## 2018-02-17 NOTE — Discharge Instructions (Signed)
Albuterol inhaler as needed for wheezing.  Use Flonase as needed for nasal congestion. Take Tessalon Perles as needed for cough. Swish and spit lidocaine solution to help with throat discomfort.  Do not swallow. Take naproxen as needed for body aches and chills. Return to ED for worsening symptoms, chest pain, vomiting or coughing up blood, shortness of breath.

## 2018-02-17 NOTE — ED Triage Notes (Signed)
Pt reports he has had a cough since Friday. Pt  Reports productive cough and sore throat.

## 2018-02-17 NOTE — ED Notes (Signed)
Declined W/C at D/C and was escorted to lobby by RN. 

## 2018-04-22 IMAGING — DX DG ELBOW COMPLETE 3+V*R*
4 series · 4 of 4 positions shown · non-contrast
Comparison: None.

CLINICAL DATA: Pain following fall

EXAM:
RIGHT ELBOW - COMPLETE 3+ VIEW

[elbow ap]
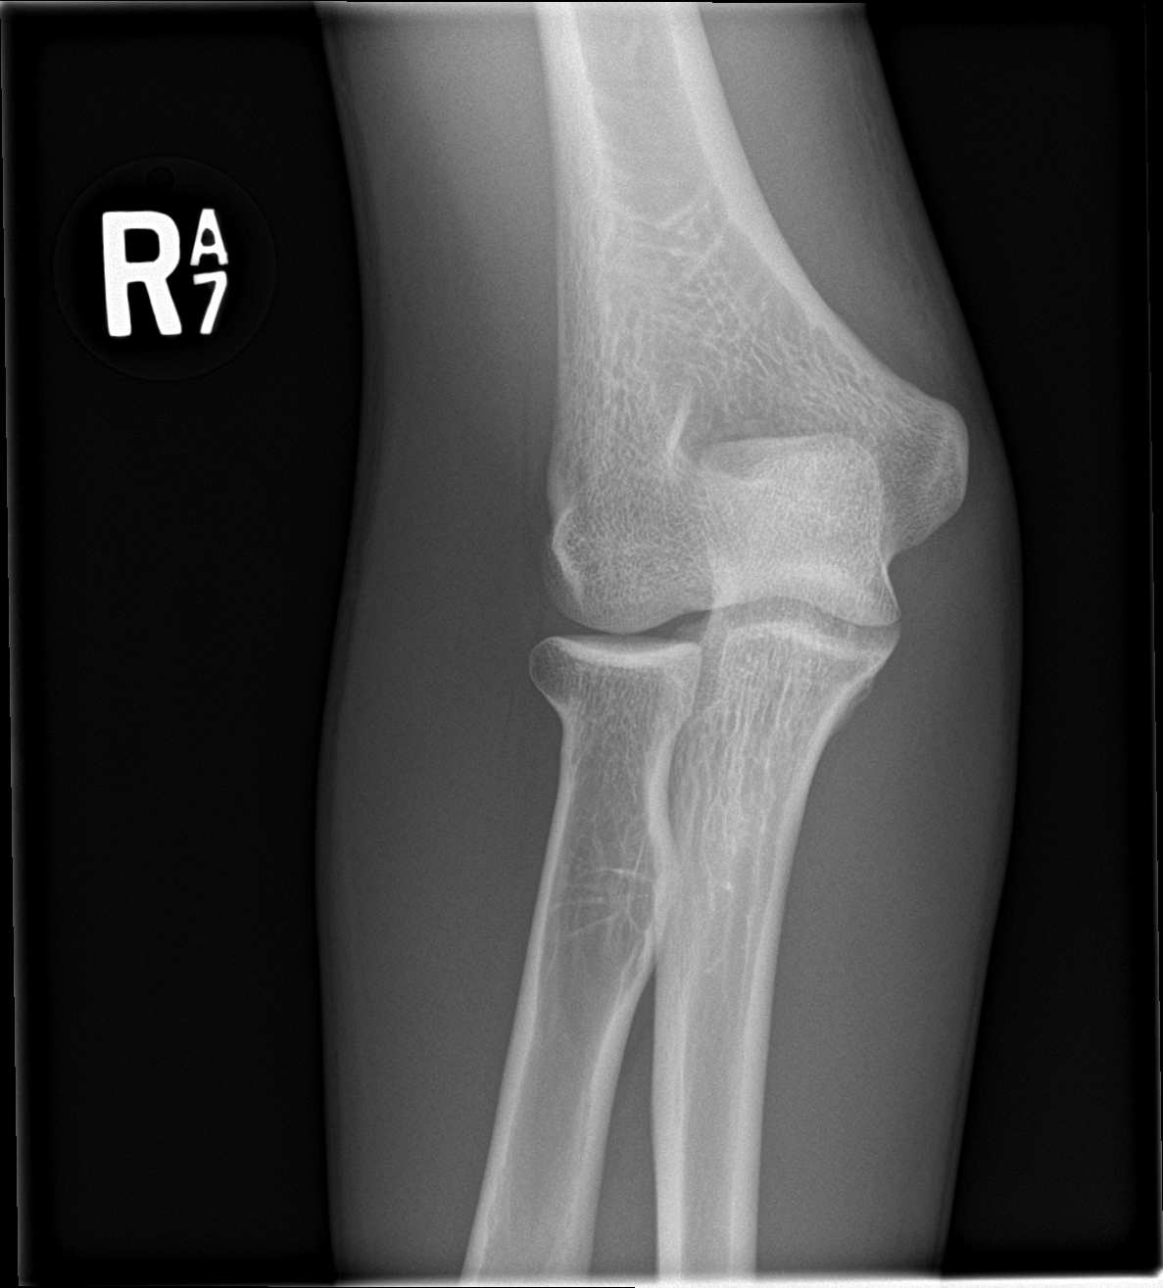

[elbow obl (1 of 2)]
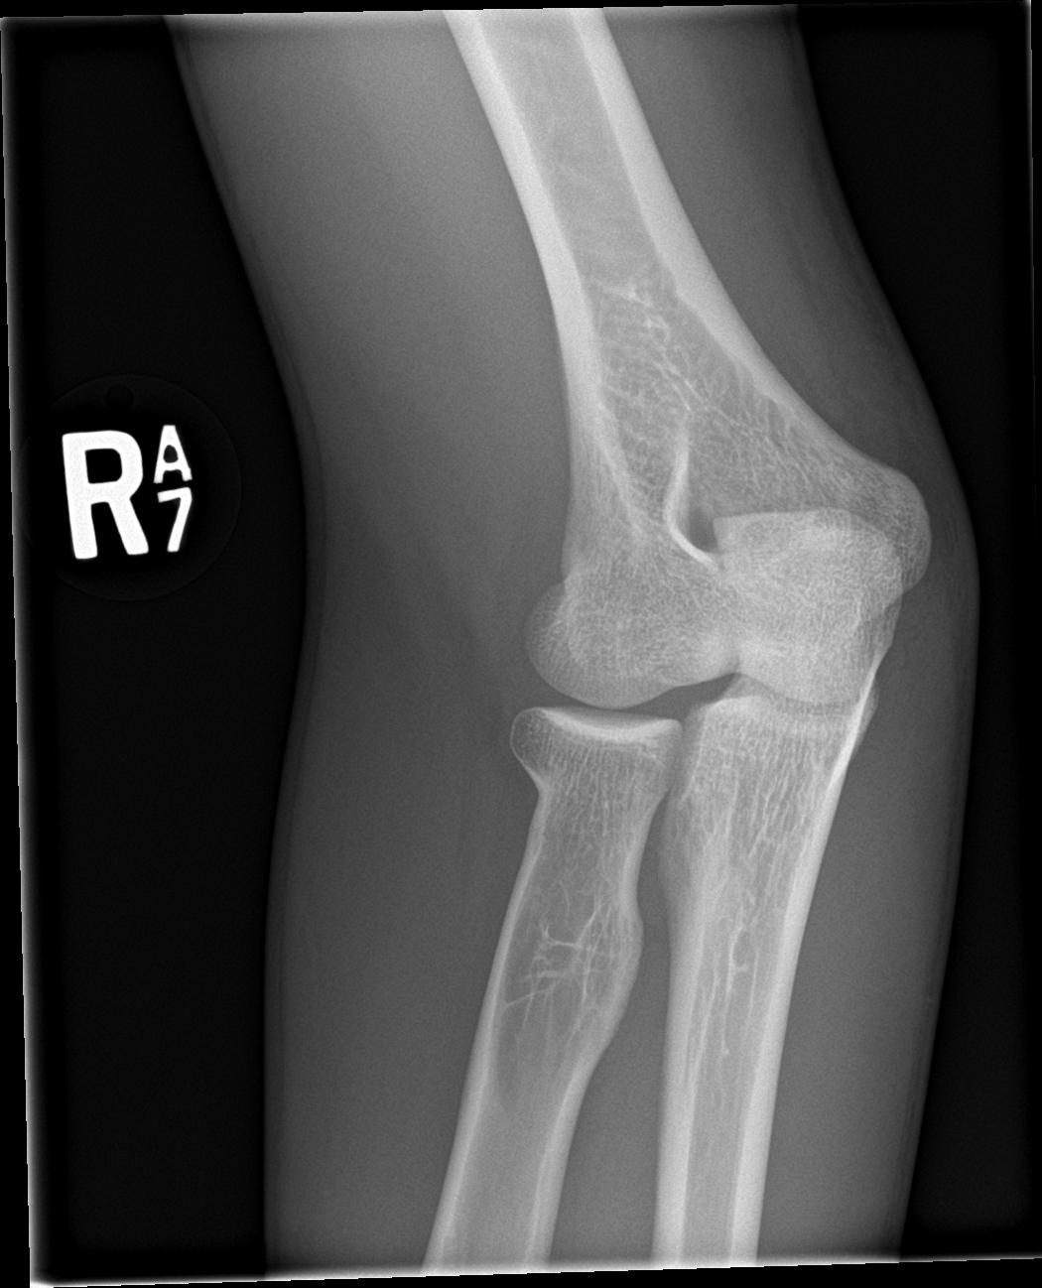

[elbow obl (2 of 2)]
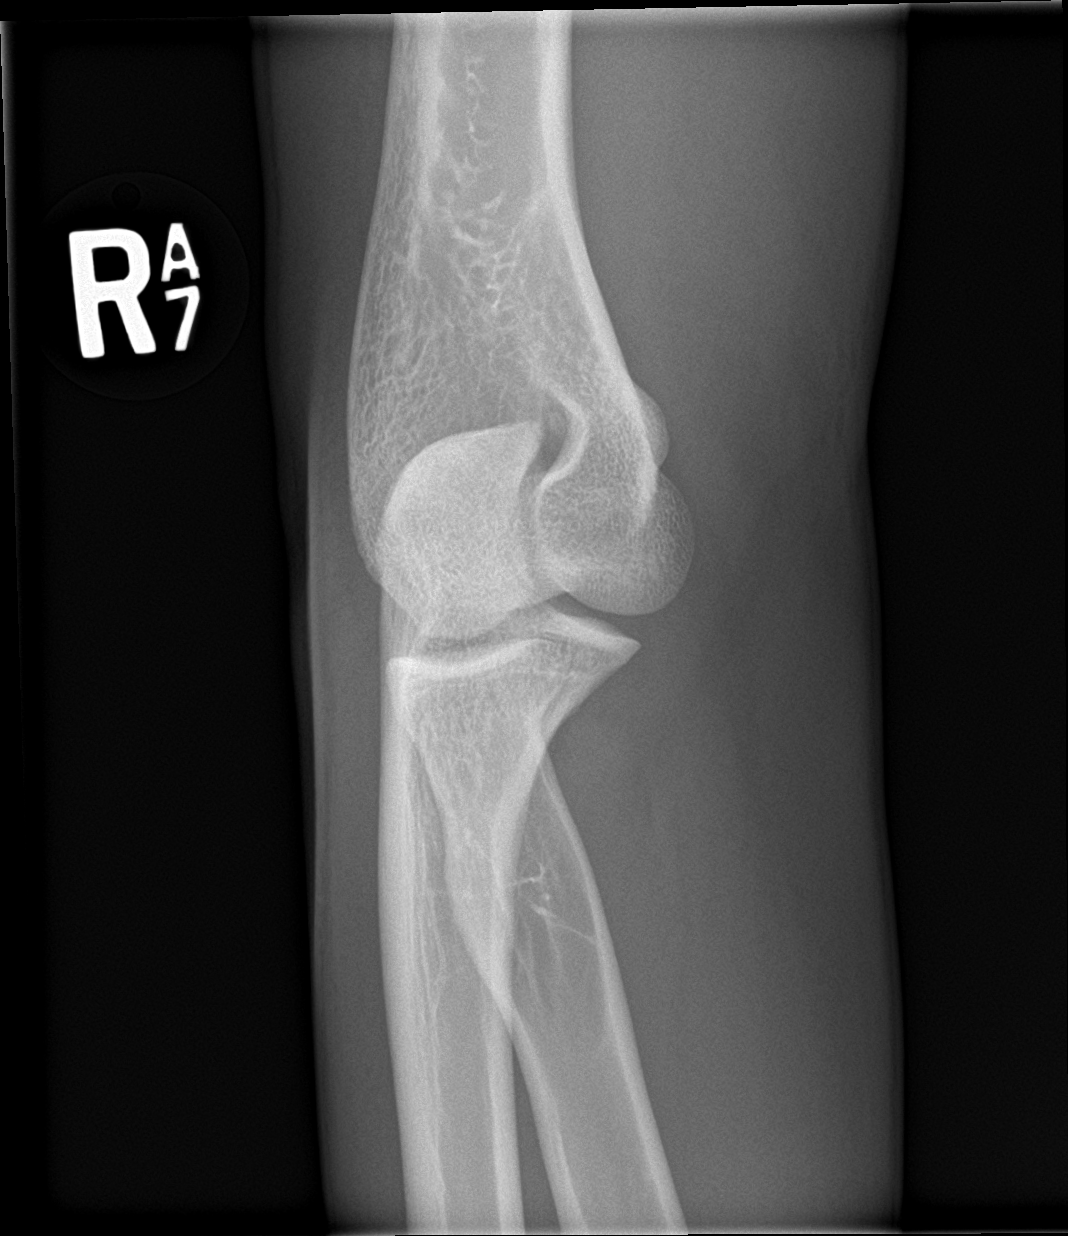

[elbow lat]
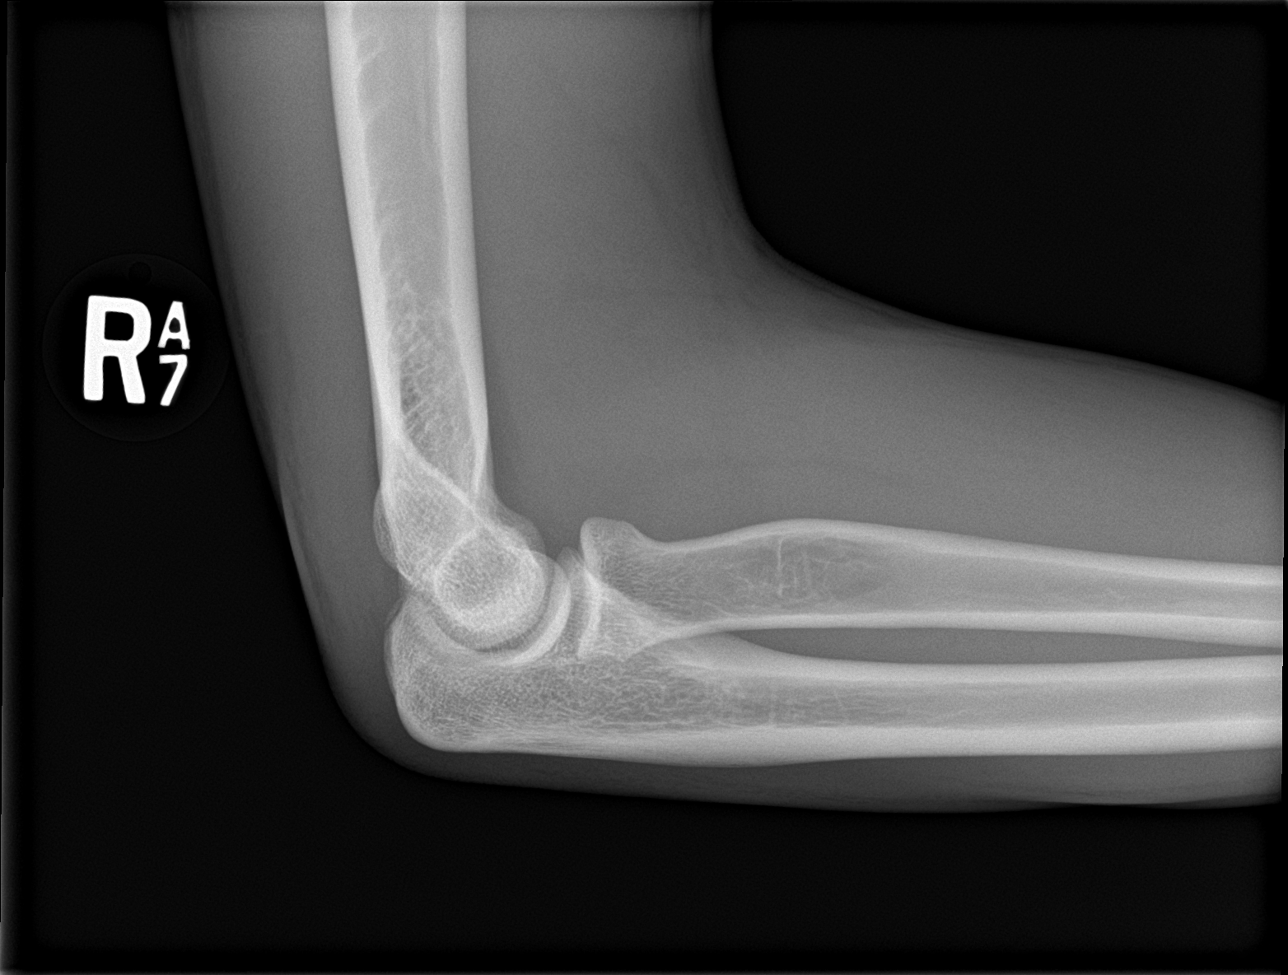

[4 of 4 positions shown; findings below may reference images not displayed]

FINDINGS: Frontal, lateral, and bilateral oblique views were obtained. There
is no fracture or dislocation. No appreciable joint effusion. The
joint spaces appear normal. No erosive change.
IMPRESSION: No fracture or dislocation.  No evident arthropathy.

## 2018-06-09 ENCOUNTER — Emergency Department (HOSPITAL_COMMUNITY)
Admission: EM | Admit: 2018-06-09 | Discharge: 2018-06-09 | Disposition: A | Discharge status: Home / Self Care | Payer: Medicaid Other | Attending: Emergency Medicine | Admitting: Emergency Medicine

## 2018-06-09 ENCOUNTER — Encounter (HOSPITAL_COMMUNITY): Payer: Self-pay | Admitting: Emergency Medicine

## 2018-06-09 ENCOUNTER — Other Ambulatory Visit: Payer: Self-pay

## 2018-06-09 DIAGNOSIS — R52 Pain, unspecified: Secondary | ICD-10-CM | POA: Insufficient documentation

## 2018-06-09 DIAGNOSIS — F17210 Nicotine dependence, cigarettes, uncomplicated: Secondary | ICD-10-CM | POA: Insufficient documentation

## 2018-06-09 DIAGNOSIS — R748 Abnormal levels of other serum enzymes: Secondary | ICD-10-CM | POA: Insufficient documentation

## 2018-06-09 DIAGNOSIS — F909 Attention-deficit hyperactivity disorder, unspecified type: Secondary | ICD-10-CM | POA: Insufficient documentation

## 2018-06-09 DIAGNOSIS — J45909 Unspecified asthma, uncomplicated: Secondary | ICD-10-CM | POA: Insufficient documentation

## 2018-06-09 LAB — BASIC METABOLIC PANEL
ANION GAP: 3 — AB (ref 5–15)
Anion gap: 4 — ABNORMAL LOW (ref 5–15)
BUN: 12 mg/dL (ref 6–20)
BUN: 11 mg/dL (ref 6–20)
CALCIUM: 8.2 mg/dL — AB (ref 8.9–10.3)
CO2: 28 mmol/L (ref 22–32)
CO2: 24 mmol/L (ref 22–32)
Calcium: 9.2 mg/dL (ref 8.9–10.3)
Chloride: 107 mmol/L (ref 98–111)
Chloride: 106 mmol/L (ref 98–111)
Creatinine, Ser: 1.26 mg/dL — ABNORMAL HIGH (ref 0.61–1.24)
Creatinine, Ser: 1.08 mg/dL (ref 0.61–1.24)
GFR calc Af Amer: >60 mL/min (ref >60)
GFR calc non Af Amer: >60 mL/min (ref >60)
Glucose, Bld: 75 mg/dL (ref 70–99)
Glucose, Bld: 80 mg/dL (ref 70–99)
Potassium: 3.9 mmol/L (ref 3.5–5.1)
Potassium: 3.7 mmol/L (ref 3.5–5.1)
Sodium: 138 mmol/L (ref 135–145)
Sodium: 134 mmol/L — ABNORMAL LOW (ref 135–145)

## 2018-06-09 LAB — URINALYSIS, ROUTINE W REFLEX MICROSCOPIC
BILIRUBIN URINE: NEGATIVE
Glucose, UA: NEGATIVE mg/dL
Hgb urine dipstick: NEGATIVE
KETONES UR: NEGATIVE mg/dL
Leukocytes,Ua: NEGATIVE
Nitrite: NEGATIVE
Protein, ur: NEGATIVE mg/dL
Specific Gravity, Urine: 1.012 (ref 1.005–1.030)
pH: 7 (ref 5.0–8.0)

## 2018-06-09 LAB — CBC WITH DIFFERENTIAL/PLATELET
Abs Immature Granulocytes: 0.03 10*3/uL (ref 0.00–0.07)
BASOS ABS: 0.1 10*3/uL (ref 0.0–0.1)
BASOS PCT: 1 %
EOS ABS: 0.3 10*3/uL (ref 0.0–0.5)
EOS PCT: 4 %
HCT: 43.7 % (ref 39.0–52.0)
Hemoglobin: 15 g/dL (ref 13.0–17.0)
Immature Granulocytes: 0 %
LYMPHS PCT: 31 %
Lymphs Abs: 2.8 10*3/uL (ref 0.7–4.0)
MCH: 29.3 pg (ref 26.0–34.0)
MCHC: 34.3 g/dL (ref 30.0–36.0)
MCV: 85.4 fL (ref 80.0–100.0)
Monocytes Absolute: 0.6 10*3/uL (ref 0.1–1.0)
Monocytes Relative: 6 %
NRBC: 0 % (ref 0.0–0.2)
Neutro Abs: 5.4 10*3/uL (ref 1.7–7.7)
Neutrophils Relative %: 58 %
PLATELETS: 178 10*3/uL (ref 150–400)
RBC: 5.12 MIL/uL (ref 4.22–5.81)
RDW: 13 % (ref 11.5–15.5)
WBC: 9.2 10*3/uL (ref 4.0–10.5)

## 2018-06-09 LAB — CK
Total CK: 3043 U/L — ABNORMAL HIGH (ref 49–397)
Total CK: 2496 U/L — ABNORMAL HIGH (ref 49–397)

## 2018-06-09 MED ORDER — ACETAMINOPHEN 325 MG PO TABS
650.0000 mg | ORAL_TABLET | Freq: Once | ORAL | Status: AC
  Administered 2018-06-09: 650 mg via ORAL
  Filled 2018-06-09: qty 2

## 2018-06-09 MED ORDER — SODIUM CHLORIDE 0.9 % IV BOLUS
2000.0000 mL | Freq: Once | INTRAVENOUS | Status: AC
  Administered 2018-06-09: 2000 mL via INTRAVENOUS

## 2018-06-09 NOTE — ED Triage Notes (Signed)
ot reports feeling "sore all over" and that some places are worse than others. Denies any trauma or injury. 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

## 2018-06-09 NOTE — Discharge Instructions (Addendum)
Your CK level was 2,496 you need this level RECHECKED IN 1 WEEK. Please schedule an appointment with your primary care physician or the [facility] Wellness for improvement of your symptoms. YOU NEED TO DRINK PLENTY OF WATER!!!! If you experience worsening symptoms return into the ED.

## 2018-06-09 NOTE — ED Provider Notes (Signed)
  Physical Exam  BP 137/86 (BP Location: Left Arm)   Pulse 70   Temp 98.5 F (36.9 C)   Resp 16   SpO2 100%   Physical Exam  ED Course/Procedures     Procedures  MDM  Patient care received from Dr. Jacqulyn Bath during signout.  Please see his note for a full HPI.  Briefly, patient with generalized body aches after starting a new job.  Gait was found to be 3000 during evaluation.  He was provided with 2 L bolus along with repeat of BMP and CK to see if it was trending down.  Plan was to dispel patient or admitted depending on CK level. 9:36 PM patient reports he is feeling about the same when he first came in, girlfriend the bedside "he ready to go ".  I explained to them the importance of a CK level.  He will need this level rechecked within 1 week.  He understands and agrees, he will return to [facility] health wellness clinic Or emergency department to have level rechecked.  Also provide patient with a note for work on Monday.  He understands and agrees with management.  Return precautions provided at length.       Claude Manges, PA-C 06/09/18 2142    Maia Plan, MD 06/10/18 1044

## 2018-06-09 NOTE — ED Provider Notes (Signed)
Emergency Department Provider Note   I have reviewed the triage vital signs and the nursing notes.   HISTORY  Chief Complaint Generalized Body Aches   HPI Samuel Farrell is a 23 y.o. male with PMH of asthma and ADHD presents to the emergency department for evaluation of diffuse body aches.  Patient describes muscle aching all over worse in his legs and ankles.  Patient states symptoms have started since working his job at [employer].  He states it is a relatively physical job with frequent lifting.  He has not started any new, vigorous workout routine.  He does not take any medications.  He states he drinks alcohol occasionally.  Denies using drugs.  He is not experiencing any specific chest pain or shortness of breath symptoms.  No fevers or chills.  No flulike symptoms.  No international travel.  Past Medical History:  Diagnosis Date  . ADHD (attention deficit hyperactivity disorder)   . Asthma   . EXNTZGYF(749.4)     Patient Active Problem List   Diagnosis Date Noted  . 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 Episodic tension type headache 06/30/2013  . Migraine with aura, without mention of intractable migraine without mention of status migrainosus 06/30/2013  . Migraine without aura, without mention of intractable migraine without mention of status migrainosus 06/30/2013  . Attention deficit disorder with hyperactivity(314.01) 06/30/2013    Past Surgical History:  Procedure Laterality Date  . MOUTH SURGERY     Allergies Penicillins  Family History  Problem Relation Age of Onset  . Migraines Mother 51  . ADD / ADHD Mother   . Learning disabilities Mother   . ADD / ADHD Father   . Learning disabilities Father   . ADD / ADHD Sister   . Learning disabilities Sister   . Migraines Maternal Grandmother        Adult  . Other Maternal Grandmother        Organ Failure died at 71  . Pancreatic cancer Maternal Grandfather        Died at 49    . Migraines Paternal Grandfather        Adult Onset  . Dementia Paternal Grandfather        Age at time of death unknown  . Cancer Paternal Grandmother        Age at time of death unknown    Social History Social History   Tobacco Use  . Smoking status: Heavy Tobacco Smoker    Packs/day: 0.50  . Smokeless tobacco: Never Used  Substance Use Topics  . Alcohol use: No  . Drug use: Yes    Frequency: 3.0 times per week    Types: Marijuana    Review of Systems  Constitutional: No fever/chills Eyes: No visual changes. ENT: No sore throat. Cardiovascular: Denies chest pain. Respiratory: Denies shortness of breath. Gastrointestinal: No abdominal pain.  No nausea, no vomiting.  No diarrhea.  No constipation. Genitourinary: Negative for dysuria. Musculoskeletal: Negative for back pain. Muscle cramping.  Skin: Negative for rash. Neurological: Negative for headaches, focal weakness or numbness.  10-point ROS otherwise negative.  ____________________________________________   PHYSICAL EXAM:  VITAL SIGNS: ED Triage Vitals [06/09/18 1538]  Enc Vitals Group     BP 125/68     Pulse Rate 64     Resp 16     Temp 98.5 F (36.9 C)     Temp src      SpO2 98 %   Constitutional: Alert and  oriented. Well appearing and in no acute distress. Eyes: Conjunctivae are normal.  Head: Atraumatic. Nose: No congestion/rhinnorhea. Mouth/Throat: Mucous membranes are moist.  Neck: No stridor.   Cardiovascular: Normal rate, regular rhythm. Good peripheral circulation. Grossly normal heart sounds.   Respiratory: Normal respiratory effort.  No retractions. Lungs CTAB. Gastrointestinal: Soft and nontender. No distention.  Musculoskeletal: No lower extremity tenderness nor edema. No gross deformities of extremities. Neurologic:  Normal speech and language. No gross focal neurologic deficits are appreciated.  Skin:  Skin is warm, dry and intact. No rash  noted.  ____________________________________________   LABS (all labs ordered are listed, but only abnormal results are displayed)  Labs Reviewed  BASIC METABOLIC PANEL - Abnormal; Notable for the following components:      Result Value   Creatinine, Ser 1.26 (*)    Anion gap 3 (*)    All other components within normal limits  CK - Abnormal; Notable for the following components:   Total CK 3,043 (*)    All other components within normal limits  BASIC METABOLIC PANEL - Abnormal; Notable for the following components:   Sodium 134 (*)    Calcium 8.2 (*)    Anion gap 4 (*)    All other components within normal limits  CK - Abnormal; Notable for the following components:   Total CK 2,496 (*)    All other components within normal limits  CBC WITH DIFFERENTIAL/PLATELET  URINALYSIS, ROUTINE W REFLEX MICROSCOPIC   ____________________________________________  RADIOLOGY  None ____________________________________________   PROCEDURES  Procedure(s) performed:   Procedures  None ____________________________________________   INITIAL IMPRESSION / ASSESSMENT AND PLAN / ED COURSE  Pertinent labs & imaging results that were available during my care of the patient were reviewed by me and considered in my medical decision making (see chart for details).  Patient presents to the emergency department for evaluation of muscle aches diffusely.  He is afebrile.  No rash.  He is ambulatory with some limping and occasional muscle cramping.  No flulike symptoms.  Plan for screening labs to assess patient's electrolytes, kidney function, and reassess.  CK elevated to 3000.  No significant acute kidney injury.  Would not expect renal impairment below 5000.  Plan for 2 L IV fluid, recheck CK/BMP and likely discharge if down-trending with instructions to drink lots of water in the coming days and provide a work note as this is possibly the source of his elevated CK.  Patient is not on  medications which I would expect to cause this.  He denies any drug use.  Patient will require PCP follow-up which I have discussed with him for repeat labs in the coming week.  ____________________________________________  FINAL CLINICAL IMPRESSION(S) / ED DIAGNOSES  Final diagnoses:  Elevated CK  Generalized body aches     MEDICATIONS GIVEN DURING THIS VISIT:  Medications  sodium chloride 0.9 % bolus 2,000 mL (0 mLs Intravenous Stopped 06/09/18 2011)  acetaminophen (TYLENOL) tablet 650 mg (650 mg Oral Given 06/09/18 2056)    Note:  This document was prepared using Dragon voice recognition software and may include unintentional dictation errors.  Alona Bene, MD Emergency Medicine    Jerell Demery, Arlyss Repress, MD 06/10/18 [phone]

## 2018-06-18 ENCOUNTER — Ambulatory Visit
Admission: EM | Admit: 2018-06-18 | Discharge: 2018-06-18 | Disposition: A | Discharge status: Home / Self Care | Payer: Medicaid Other | Attending: Family Medicine | Admitting: Family Medicine

## 2018-06-18 DIAGNOSIS — Z09 Encounter for follow-up examination after completed treatment for conditions other than malignant neoplasm: Secondary | ICD-10-CM

## 2018-06-18 DIAGNOSIS — R748 Abnormal levels of other serum enzymes: Secondary | ICD-10-CM

## 2018-06-18 NOTE — Discharge Instructions (Signed)
We will call with blood work results and further recommendations

## 2018-06-18 NOTE — ED Triage Notes (Signed)
Pt states needs his CK level rechecked from last week.

## 2018-06-19 LAB — BASIC METABOLIC PANEL
BUN/Creatinine Ratio: 12 (ref 9–20)
BUN: 15 mg/dL (ref 6–20)
CO2: 23 mmol/L (ref 20–29)
Calcium: 9.9 mg/dL (ref 8.7–10.2)
Chloride: 102 mmol/L (ref 96–106)
Creatinine, Ser: 1.24 mg/dL (ref 0.76–1.27)
GFR calc Af Amer: 95 mL/min/{1.73_m2} (ref >59)
GFR, EST NON AFRICAN AMERICAN: 83 mL/min/{1.73_m2} (ref >59)
Glucose: 85 mg/dL (ref 65–99)
Potassium: 4.2 mmol/L (ref 3.5–5.2)
SODIUM: 140 mmol/L (ref 134–144)

## 2018-06-19 LAB — CK: Total CK: 468 U/L — ABNORMAL HIGH (ref 24–204)

## 2018-06-19 NOTE — ED Provider Notes (Signed)
[facility]    CSN: [id number] Arrival date & time: 06/18/18  1239     History   Chief Complaint Chief Complaint  Patient presents with  . needs ck level recheck    HPI Samuel Farrell is a 23 y.o. male history of asthma, ADHD presenting today for recheck of CK level.  Patient was seen in the emergency room approximately 10 days ago for muscle cramps and body aches.  He had a CK elevation of 3000 and slight elevation of his creatinine.  He was given fluids, CK level dropped to approximately 2500.  He was discharged and recommended to have follow-up labs in 1 week.  Patient states that since he has continued to have some mild cramping in his lower legs, mainly noted behind his knees.  The cramping that he was experiencing his upper extremities and chest has improved.  He did note that prior to onset of all of his symptoms he had been exerting himself a lot at work as well as playing a lot of basketball and exercise.  He notes that he was not drinking a lot of fluids and had poor diet.  He has been trying to focus on hydration since.  Is also requesting a note to return to work.  He does not have a primary care at this time.  HPI  Past Medical History:  Diagnosis Date  . ADHD (attention deficit hyperactivity disorder)   . Asthma   . JEHUDJSH(702.6)     Patient Active Problem List   Diagnosis Date Noted  . 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 Episodic tension type headache 06/30/2013  . Migraine with aura, without mention of intractable migraine without mention of status migrainosus 06/30/2013  . Migraine without aura, without mention of intractable migraine without mention of status migrainosus 06/30/2013  . Attention deficit disorder with hyperactivity(314.01) 06/30/2013    Past Surgical History:  Procedure Laterality Date  . MOUTH SURGERY         Home Medications    Prior to Admission medications   Medication Sig Start Date  End Date Taking? Authorizing Provider  acetaminophen (TYLENOL) 500 MG tablet Take 1 tablet (500 mg total) by mouth every 6 (six) hours as needed. Patient not taking: Reported on 06/09/2018 04/30/17   Emi Holes, PA-C  albuterol (PROVENTIL HFA;VENTOLIN HFA) 108 (90 Base) MCG/ACT inhaler Inhale 1-2 puffs into the lungs every 6 (six) hours as needed for wheezing or shortness of breath. 02/17/18   Khatri, Hina, PA-C  benzonatate (TESSALON) 100 MG capsule Take 2 capsules (200 mg total) by mouth every 8 (eight) hours. Patient not taking: Reported on 06/09/2018 02/17/18   Dietrich Pates, PA-C  fluticasone (FLONASE) 50 MCG/ACT nasal spray Place 1 spray into both nostrils daily. Patient not taking: Reported on 06/09/2018 02/17/18   Dietrich Pates, PA-C  ibuprofen (ADVIL,MOTRIN) 600 MG tablet Take 1 tablet (600 mg total) by mouth every 6 (six) hours as needed. Patient not taking: Reported on 06/09/2018 11/01/17   Emari Hreha C, PA-C  lidocaine (XYLOCAINE) 2 % solution Use as directed 15 mLs in the mouth or throat as needed for mouth pain. Patient not taking: Reported on 06/09/2018 02/17/18   Dietrich Pates, PA-C  methocarbamol (ROBAXIN) 500 MG tablet Take 1 tablet (500 mg total) by mouth 2 (two) times daily. Patient not taking: Reported on 06/09/2018 04/30/17   Emi Holes, PA-C  mirtazapine (REMERON) 7.5 MG tablet Take 7.5 mg by mouth at bedtime as needed (  sleep).    [provider]  naproxen (NAPROSYN) 500 MG tablet Take 1 tablet (500 mg total) by mouth 2 (two) times daily. Patient not taking: Reported on 06/09/2018 02/17/18   Dietrich Pates, PA-C  naproxen sodium (ALEVE) 220 MG tablet Take 660 mg by mouth daily as needed (pain).    [provider]    Family History Family History  Problem Relation Age of Onset  . Migraines Mother 14  . ADD / ADHD Mother   . Learning disabilities Mother   . ADD / ADHD Father   . Learning disabilities Father   . ADD / ADHD Sister   . Learning disabilities  Sister   . Migraines Maternal Grandmother        Adult  . Other Maternal Grandmother        Organ Failure died at 14  . Pancreatic cancer Maternal Grandfather        Died at 7  . Migraines Paternal Grandfather        Adult Onset  . Dementia Paternal Grandfather        Age at time of death unknown  . Cancer Paternal Grandmother        Age at time of death unknown    Social History Social History   Tobacco Use  . Smoking status: Heavy Tobacco Smoker    Packs/day: 0.50  . Smokeless tobacco: Never Used  Substance Use Topics  . Alcohol use: No  . Drug use: Yes    Frequency: 3.0 times per week    Types: Marijuana     Allergies   Penicillins   Review of Systems Review of Systems  Constitutional: Negative for fatigue and fever.  HENT: Negative for congestion, sinus pressure and sore throat.   Eyes: Negative for photophobia, pain and visual disturbance.  Respiratory: Negative for cough and shortness of breath.   Cardiovascular: Negative for chest pain.  Gastrointestinal: Negative for abdominal pain, nausea and vomiting.  Genitourinary: Negative for decreased urine volume and hematuria.  Musculoskeletal: Positive for myalgias. Negative for neck pain and neck stiffness.  Neurological: Positive for headaches. Negative for dizziness, syncope, facial asymmetry, speech difficulty, weakness, light-headedness and numbness.     Physical Exam Triage Vital Signs ED Triage Vitals  Enc Vitals Group     BP 06/18/18 1255 126/89     Pulse Rate 06/18/18 1255 60     Resp 06/18/18 1255 18     Temp 06/18/18 1255 98.2 F (36.8 C)     Temp Source 06/18/18 1255 Oral     SpO2 06/18/18 1255 98 %     Weight --      Height --      Head Circumference --      Peak Flow --      Pain Score 06/18/18 1256 9     Pain Loc --      Pain Edu? --      Excl. in [place]? --    No data found.  Updated Vital Signs BP 126/89 (BP Location: Left Arm)   Pulse 60   Temp 98.2 F (36.8 C) (Oral)   Resp  18   SpO2 98%   Visual Acuity Right Eye Distance:   Left Eye Distance:   Bilateral Distance:    Right Eye Near:   Left Eye Near:    Bilateral Near:     Physical Exam Vitals signs and nursing note reviewed.  Constitutional:      Appearance: He is well-developed.  HENT:     Head: Normocephalic and atraumatic.     Mouth/Throat:     Comments: Oral mucosa pink and moist, no tonsillar enlargement or exudate. Posterior pharynx patent and nonerythematous, no uvula deviation or swelling. Normal phonation. Eyes:     Extraocular Movements: Extraocular movements intact.     Conjunctiva/sclera: Conjunctivae normal.     Pupils: Pupils are equal, round, and reactive to light.  Neck:     Musculoskeletal: Neck supple.  Cardiovascular:     Rate and Rhythm: Normal rate and regular rhythm.     Heart sounds: No murmur.  Pulmonary:     Effort: Pulmonary effort is normal. No respiratory distress.     Breath sounds: Normal breath sounds.     Comments: Breathing comfortably at rest, CTABL, no wheezing, rales or other adventitious sounds auscultated Abdominal:     Palpations: Abdomen is soft.     Tenderness: There is no abdominal tenderness.  Musculoskeletal:     Comments: Strength in hips and knees 5/5 and equal bilaterally, patellar reflex 2+ Gait normal  Skin:    General: Skin is warm and dry.  Neurological:     Mental Status: He is alert.      UC Treatments / Results  Labs (all labs ordered are listed, but only abnormal results are displayed) Labs Reviewed  CK - Abnormal; Notable for the following components:      Result Value   Total CK 468 (*)    All other components within normal limits   Narrative:    Performed at:  [address], [place], [place]  [id number] Lab Director: Jolene Schimke MD, Phone:  [phone]  BASIC METABOLIC PANEL   Narrative:    Performed at:  [address], [place], [place]  [id number] Lab Director:  Jolene Schimke MD, Phone:  [phone]    EKG None  Radiology No results found.  Procedures Procedures (including critical care time)  Medications Ordered in UC Medications - No data to display  Initial Impression / Assessment and Plan / UC Course  I have reviewed the triage vital signs and the nursing notes.  Pertinent labs & imaging results that were available during my care of the patient were reviewed by me and considered in my medical decision making (see chart for details).     Patient with elevated CK, possible rhabdo approximately 10 days ago.  Will recheck CK today, will hold off on recommendations for work until results obtained.  Symptomatically he is improving, and would expect the CK to continue to trend down.  If CK improved as well will allow patient to return to work.  CK level 468, patient contacted and informed of significant provement in levels, still slightly elevated.  Advised to continue to push fluids, work on [employer], avoid overexertion.  If cramping/symptoms returning to follow-up.  Discussed strict return precautions. Patient verbalized understanding and is agreeable with plan.  Final Clinical Impressions(s) / UC Diagnoses   Final diagnoses:  Elevated CK  Follow up     Discharge Instructions     We will call with blood work results and further recommendations    ED Prescriptions    None     Controlled Substance Prescriptions  Controlled Substance Registry consulted? Not Applicable   Lew Dawes, [place] 06/19/18 1121

## 2018-07-14 ENCOUNTER — Ambulatory Visit
Admission: EM | Admit: 2018-07-14 | Discharge: 2018-07-14 | Discharge status: Absent without leave | Payer: Medicaid Other

## 2018-07-14 ENCOUNTER — Other Ambulatory Visit: Payer: Self-pay

## 2018-10-03 ENCOUNTER — Encounter (HOSPITAL_COMMUNITY): Payer: Self-pay | Admitting: Emergency Medicine

## 2018-10-03 ENCOUNTER — Other Ambulatory Visit: Payer: Self-pay

## 2018-10-03 ENCOUNTER — Ambulatory Visit (HOSPITAL_COMMUNITY)
Admission: EM | Admit: 2018-10-03 | Discharge: 2018-10-03 | Disposition: A | Discharge status: Home / Self Care | Payer: Medicaid Other | Attending: Physician Assistant | Admitting: Physician Assistant

## 2018-10-03 DIAGNOSIS — H9201 Otalgia, right ear: Secondary | ICD-10-CM

## 2018-10-03 MED ORDER — NEOMYCIN-POLYMYXIN-HC 3.5-10000-1 OT SUSP: 4.0000 [drp] | Freq: Three times a day (TID) | OTIC | 0 refills | Status: AC

## 2018-10-03 NOTE — ED Provider Notes (Signed)
[facility]    CSN: [id number] Arrival date & time: 10/03/18  1415     History   Chief Complaint Chief Complaint  Patient presents with  . Otalgia    HPI Samuel Farrell is a 23 y.o. male.   23 year old male comes in for 2-day history of right ear pain.  States noticed lesion to the right ear canal, but unsure how long it is been there.  Area is tender to palpation.  Denies ear drainage, changes in hearing.  Denies URI symptoms such as cough, congestion, sore throat.  Denies fever, chills, night sweats.  Has not tried anything for the symptoms.     Past Medical History:  Diagnosis Date  . ADHD (attention deficit hyperactivity disorder)   . Asthma   . EXBMWUXL(244.0Headache(784.0)     Patient Active Problem List   Diagnosis Date Noted  . 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 Episodic tension type headache 06/30/2013  . Migraine with aura, without mention of intractable migraine without mention of status migrainosus 06/30/2013  . Migraine without aura, without mention of intractable migraine without mention of status migrainosus 06/30/2013  . Attention deficit disorder with hyperactivity(314.01) 06/30/2013    Past Surgical History:  Procedure Laterality Date  . MOUTH SURGERY         Home Medications    Prior to Admission medications   Medication Sig Start Date End Date Taking? Authorizing Provider  acetaminophen (TYLENOL) 500 MG tablet Take 1 tablet (500 mg total) by mouth every 6 (six) hours as needed. Patient not taking: Reported on 06/09/2018 04/30/17   Emi HolesLaw, Alexandra M, PA-C  albuterol (PROVENTIL HFA;VENTOLIN HFA) 108 (90 Base) MCG/ACT inhaler Inhale 1-2 puffs into the lungs every 6 (six) hours as needed for wheezing or shortness of breath. 02/17/18   Khatri, Hina, PA-C  mirtazapine (REMERON) 7.5 MG tablet Take 7.5 mg by mouth at bedtime as needed (sleep).    [provider]  naproxen sodium (ALEVE) 220 MG tablet Take 660 mg  by mouth daily as needed (pain).    [provider]  neomycin-polymyxin-hydrocortisone (CORTISPORIN) 3.5-10000-1 OTIC suspension Place 4 drops into the right ear 3 (three) times daily for 7 days. 10/03/18 10/10/18  Cathie HoopsYu, Shanzay Hepworth V, PA-C  fluticasone (FLONASE) 50 MCG/ACT nasal spray Place 1 spray into both nostrils daily. Patient not taking: Reported on 06/09/2018 02/17/18 10/03/18  Dietrich PatesKhatri, Hina, PA-C    Family History Family History  Problem Relation Age of Onset  . Migraines Mother 3510  . ADD / ADHD Mother   . Learning disabilities Mother   . ADD / ADHD Father   . Learning disabilities Father   . ADD / ADHD Sister   . Learning disabilities Sister   . Migraines Maternal Grandmother        Adult  . Other Maternal Grandmother        Organ Failure died at 2161  . Pancreatic cancer Maternal Grandfather        Died at 2351  . Migraines Paternal Grandfather        Adult Onset  . Dementia Paternal Grandfather        Age at time of death unknown  . Cancer Paternal Grandmother        Age at time of death unknown    Social History Social History   Tobacco Use  . Smoking status: Heavy Tobacco Smoker    Packs/day: 0.50  . Smokeless tobacco: Never Used  Substance Use Topics  .  Alcohol use: No  . Drug use: Yes    Frequency: 3.0 times per week    Types: Marijuana     Allergies   Penicillins   Review of Systems Review of Systems  Reason unable to perform ROS: See HPI as above.     Physical Exam Triage Vital Signs ED Triage Vitals  Enc Vitals Group     BP 10/03/18 1449 129/66     Pulse Rate 10/03/18 1449 61     Resp 10/03/18 1449 16     Temp 10/03/18 1449 98.5 F (36.9 C)     Temp Source 10/03/18 1449 Oral     SpO2 10/03/18 1449 100 %     Weight --      Height --      Head Circumference --      Peak Flow --      Pain Score 10/03/18 1450 4     Pain Loc --      Pain Edu? --      Excl. in [place]? --    No data found.  Updated Vital Signs BP 129/66 (BP Location: Right  Arm)   Pulse 61   Temp 98.5 F (36.9 C) (Oral)   Resp 16   SpO2 100%   Physical Exam Constitutional:      General: He is not in acute distress.    Appearance: He is well-developed. He is not ill-appearing, toxic-appearing or diaphoretic.  HENT:     Head: Normocephalic and atraumatic.     Left Ear: Tympanic membrane, ear canal and external ear normal. Tympanic   membrane is not erythematous or bulging.     Ears:     Comments: Mild tenderness to palpation of right tragus.  Small lesion to the 10 o'clock region, see picture. Mild ear canal swelling, no erythema. Mild tenderness to palpation to area. TM normal.  Eyes:     Conjunctiva/sclera: Conjunctivae normal.     Pupils: Pupils are equal, round, and reactive to light.  Neurological:     Mental Status: He is alert and oriented to person, place, and time.      UC Treatments / Results  Labs (all labs ordered are listed, but only abnormal results are displayed) Labs Reviewed - No data to display  EKG None  Radiology No results found.  Procedures Procedures (including critical care time)  Medications Ordered in UC Medications - No data to display  Initial Impression / Assessment and Plan / UC Course  I have reviewed the triage vital signs and the nursing notes.  Pertinent labs & imaging results that were available during my care of the patient were reviewed by me and considered in my medical decision making (see chart for details).    Will provide Cortisporin to cover for otitis externa.  However, given goals seen to the ear, patient to follow-up with ENT for further evaluation and management needed.  Return precautions given.  Patient expresses understanding and agrees to plan.  Final Clinical Impressions(s) / UC Diagnoses   Final diagnoses:  Otalgia of right ear    ED Prescriptions    Medication Sig Dispense Auth. Provider   neomycin-polymyxin-hydrocortisone (CORTISPORIN) 3.5-10000-1 OTIC suspension Place 4  drops into the right ear 3 (three) times daily for 7 days. 4.2 mL Tobin Chad, [place] 10/03/18 1916

## 2018-10-03 NOTE — ED Triage Notes (Signed)
Pt pt he had noticed more wax in his right ear about 2 days ago and now having some pain in that ear. Tender to touch inside.

## 2018-10-03 NOTE — Discharge Instructions (Signed)
Start cortisporin to cover for right ear ear canal swelling. However, given growth seen to the ear, please follow up with ENT for further evaluation if symptoms not improving. If symptoms resolve, but notice the mass is enlarging, please also follow up with ENT for further evaluation.

## 2020-05-18 ENCOUNTER — Emergency Department (HOSPITAL_COMMUNITY)
Admission: EM | Admit: 2020-05-18 | Discharge: 2020-05-19 | Disposition: A | Discharge status: Home / Self Care | Payer: Medicaid Other | Attending: Emergency Medicine | Admitting: Emergency Medicine

## 2020-05-18 ENCOUNTER — Emergency Department (HOSPITAL_COMMUNITY): Payer: Medicaid Other

## 2020-05-18 ENCOUNTER — Other Ambulatory Visit: Payer: Self-pay

## 2020-05-18 ENCOUNTER — Encounter (HOSPITAL_COMMUNITY): Payer: Self-pay | Admitting: Emergency Medicine

## 2020-05-18 DIAGNOSIS — F17200 Nicotine dependence, unspecified, uncomplicated: Secondary | ICD-10-CM | POA: Insufficient documentation

## 2020-05-18 DIAGNOSIS — X501XXA Overexertion from prolonged static or awkward postures, initial encounter: Secondary | ICD-10-CM | POA: Insufficient documentation

## 2020-05-18 DIAGNOSIS — J45909 Unspecified asthma, uncomplicated: Secondary | ICD-10-CM | POA: Insufficient documentation

## 2020-05-18 DIAGNOSIS — S76011A Strain of muscle, fascia and tendon of right hip, initial encounter: Secondary | ICD-10-CM | POA: Insufficient documentation

## 2020-05-18 DIAGNOSIS — Y990 Civilian activity done for income or pay: Secondary | ICD-10-CM | POA: Insufficient documentation

## 2020-05-18 NOTE — ED Triage Notes (Addendum)
Patient reports injury to right anterior hip joint while twisting at work last Wednesday with pain worse with movement . Ambulatory.

## 2020-05-19 MED ORDER — NAPROXEN 500 MG PO TABS: 500.0000 mg | ORAL_TABLET | Freq: Two times a day (BID) | ORAL | 0 refills | Status: DC

## 2020-05-19 NOTE — ED Provider Notes (Signed)
[facility] EMERGENCY DEPARTMENT Provider Note   CSN: [id number] Arrival date & time: 05/18/20  2313     History Chief Complaint  Patient presents with  . Hip Pain    Samuel Farrell is a 25 y.o. male.  Patient is a 25 year old male with history of ADHD, asthma.  He presents today for evaluation of right hip pain.  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  He has been having discomfort since then that is worse with ambulating.  He denies any weakness or numbness.  The history is provided by the patient.  Hip Pain This is a new problem. The current episode started 2 days ago. The problem occurs constantly. The problem has been gradually worsening. The symptoms are aggravated by walking. Nothing relieves the symptoms. He has tried nothing for the symptoms.       Past Medical History:  Diagnosis Date  . ADHD (attention deficit hyperactivity disorder)   . Asthma   . ZSWFUXNA(355.7)     Patient Active Problem List   Diagnosis Date Noted  . 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 Episodic tension type headache 06/30/2013  . Migraine with aura, without mention of intractable migraine without mention of status migrainosus 06/30/2013  . Migraine without aura, without mention of intractable migraine without mention of status migrainosus 06/30/2013  . Attention deficit disorder with hyperactivity(314.01) 06/30/2013    Past Surgical History:  Procedure Laterality Date  . MOUTH SURGERY         Family History  Problem Relation Age of Onset  . Migraines Mother 85  . ADD / ADHD Mother   . Learning disabilities Mother   . ADD / ADHD Father   . Learning disabilities Father   . ADD / ADHD Sister   . Learning disabilities Sister   . Migraines Maternal Grandmother        Adult  . Other Maternal Grandmother        Organ Failure died at 62  . Pancreatic cancer Maternal  Grandfather        Died at 34  . Migraines Paternal Grandfather        Adult Onset  . Dementia Paternal Grandfather        Age at time of death unknown  . Cancer Paternal Grandmother        Age at time of death unknown    Social History   Tobacco Use  . Smoking status: Heavy Tobacco Smoker    Packs/day: 0.50  . Smokeless tobacco: Never Used  Vaping Use  . Vaping Use: Never used  Substance Use Topics  . Alcohol use: No  . Drug use: Yes    Frequency: 3.0 times per week    Types: Marijuana    Home Medications Prior to Admission medications   Medication Sig Start Date End Date Taking? Authorizing Provider  acetaminophen (TYLENOL) 500 MG tablet Take 1 tablet (500 mg total) by mouth every 6 (six) hours as needed. Patient not taking: Reported on 06/09/2018 04/30/17   Emi Holes, PA-C  albuterol (PROVENTIL HFA;VENTOLIN HFA) 108 (90 Base) MCG/ACT inhaler Inhale 1-2 puffs into the lungs every 6 (six) hours as needed for wheezing or shortness of breath. 02/17/18   Khatri, Hina, PA-C  mirtazapine (REMERON) 7.5 MG tablet Take 7.5 mg by mouth at bedtime as needed (sleep).    [provider]  naproxen sodium (ALEVE)  220 MG tablet Take 660 mg by mouth daily as needed (pain).    [provider]  fluticasone (FLONASE) 50 MCG/ACT nasal spray Place 1 spray into both nostrils daily. Patient not taking: Reported on 06/09/2018 02/17/18 10/03/18  Dietrich Pates, PA-C    Allergies    Penicillins  Review of Systems   Review of Systems  All other systems reviewed and are negative.   Physical Exam Updated Vital Signs BP 137/80 (BP Location: Left Arm)   Pulse 80   Temp 98.7 F (37.1 C)   Resp 20   Ht 5\' 11"  (1.803 m)   Wt 75 kg   SpO2 98%   BMI 23.06 kg/m   Physical Exam Vitals and nursing note reviewed.  Constitutional:      General: He is not in acute distress.    Appearance: Normal appearance. He is not ill-appearing, toxic-appearing or diaphoretic.  HENT:      Head: Normocephalic and atraumatic.  Pulmonary:     Effort: Pulmonary effort is normal.  Musculoskeletal:     Comments: The right hip appears grossly normal.  There is tenderness to palpation over the hip flexor and lateral aspect of the hip.  He has good range of motion, but pain seems worse when he bears weight.  Distal PMS is intact.  Skin:    General: Skin is warm and dry.  Neurological:     Mental Status: He is alert and oriented to person, place, and time.     ED Results / Procedures / Treatments   Labs (all labs ordered are listed, but only abnormal results are displayed) Labs Reviewed - No data to display  EKG None  Radiology DG Hip Unilat W or Wo Pelvis 2-3 Views Right  Result Date: 05/18/2020 CLINICAL DATA:  Right hip pain after twisting injury. EXAM: DG HIP (WITH OR WITHOUT PELVIS) 2-3V RIGHT COMPARISON:  None. FINDINGS: There is no evidence of hip fracture or dislocation. There is no evidence of arthropathy or other focal bone abnormality. IMPRESSION: Negative. Electronically Signed   By: 07/16/2020 M.D.   On: 05/18/2020 23:51    Procedures Procedures   Medications Ordered in ED Medications - No data to display  ED Course  I have reviewed the triage vital signs and the nursing notes.  Pertinent labs & imaging results that were available during my care of the patient were reviewed by me and considered in my medical decision making (see chart for details).    MDM Rules/Calculators/A&P  Patient to be treated for a hip strain.  He will be started on anti-inflammatories, tramadol, and is to rest, and follow-up with primary doctor if not improving.  Work excuse given.  Final Clinical Impression(s) / ED Diagnoses Final diagnoses:  None    Rx / DC Orders ED Discharge Orders    None       07/16/2020, MD 05/19/20 [id number]

## 2020-05-19 NOTE — Discharge Instructions (Signed)
Begin taking naproxen as prescribed.  Rest.  Heating pad to the affected area.  Follow-up with primary doctor if not improving in the next week.

## 2021-01-19 ENCOUNTER — Ambulatory Visit (HOSPITAL_COMMUNITY)
Admission: EM | Admit: 2021-01-19 | Discharge: 2021-01-19 | Disposition: A | Discharge status: Home / Self Care | Payer: Medicaid Other

## 2021-11-18 ENCOUNTER — Other Ambulatory Visit: Payer: Self-pay

## 2021-11-18 ENCOUNTER — Emergency Department (HOSPITAL_COMMUNITY)
Admission: EM | Admit: 2021-11-18 | Discharge: 2021-11-19 | Discharge status: Against Medical Advice | Payer: Self-pay | Attending: Emergency Medicine | Admitting: Emergency Medicine

## 2021-11-18 ENCOUNTER — Encounter (HOSPITAL_COMMUNITY): Payer: Self-pay

## 2021-11-18 DIAGNOSIS — R6884 Jaw pain: Secondary | ICD-10-CM | POA: Insufficient documentation

## 2021-11-18 DIAGNOSIS — Z5321 Procedure and treatment not carried out due to patient leaving prior to being seen by health care provider: Secondary | ICD-10-CM | POA: Insufficient documentation

## 2021-11-18 DIAGNOSIS — M62838 Other muscle spasm: Secondary | ICD-10-CM | POA: Insufficient documentation
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
## 2021-11-19 ENCOUNTER — Emergency Department (HOSPITAL_COMMUNITY)
Admission: EM | Admit: 2021-11-19 | Discharge: 2021-11-19 | Discharge status: Against Medical Advice | Payer: Self-pay | Attending: Emergency Medicine | Admitting: Emergency Medicine

## 2021-11-19 ENCOUNTER — Emergency Department (HOSPITAL_BASED_OUTPATIENT_CLINIC_OR_DEPARTMENT_OTHER)
Admission: EM | Admit: 2021-11-19 | Discharge: 2021-11-19 | Disposition: A | Discharge status: Home / Self Care | Payer: Self-pay | Attending: Emergency Medicine | Admitting: Emergency Medicine

## 2021-11-19 ENCOUNTER — Other Ambulatory Visit: Payer: Self-pay

## 2021-11-19 ENCOUNTER — Emergency Department (HOSPITAL_COMMUNITY): Payer: Self-pay

## 2021-11-19 ENCOUNTER — Encounter (HOSPITAL_COMMUNITY): Payer: Self-pay

## 2021-11-19 ENCOUNTER — Encounter (HOSPITAL_BASED_OUTPATIENT_CLINIC_OR_DEPARTMENT_OTHER): Payer: Self-pay | Admitting: Emergency Medicine

## 2021-11-19 DIAGNOSIS — U071 COVID-19: Secondary | ICD-10-CM | POA: Insufficient documentation

## 2021-11-19 DIAGNOSIS — Z5321 Procedure and treatment not carried out due to patient leaving prior to being seen by health care provider: Secondary | ICD-10-CM | POA: Insufficient documentation

## 2021-11-19 LAB — CBC WITH DIFFERENTIAL/PLATELET
Abs Immature Granulocytes: 0.01 10*3/uL (ref 0.00–0.07)
Basophils Absolute: 0 10*3/uL (ref 0.0–0.1)
Basophils Relative: 1 %
Eosinophils Absolute: 0.3 10*3/uL (ref 0.0–0.5)
Eosinophils Relative: 5 %
HCT: 45.6 % (ref 39.0–52.0)
Hemoglobin: 16.1 g/dL (ref 13.0–17.0)
Immature Granulocytes: 0 %
Lymphocytes Relative: 29 %
Lymphs Abs: 1.9 10*3/uL (ref 0.7–4.0)
MCH: 30.1 pg (ref 26.0–34.0)
MCHC: 35.3 g/dL (ref 30.0–36.0)
MCV: 85.4 fL (ref 80.0–100.0)
Monocytes Absolute: 1 10*3/uL (ref 0.1–1.0)
Monocytes Relative: 16 %
Neutro Abs: 3.3 10*3/uL (ref 1.7–7.7)
Neutrophils Relative %: 49 %
Platelets: 174 10*3/uL (ref 150–400)
RBC: 5.34 MIL/uL (ref 4.22–5.81)
RDW: 13.5 % (ref 11.5–15.5)
WBC: 6.6 10*3/uL (ref 4.0–10.5)
nRBC: 0 % (ref 0.0–0.2)

## 2021-11-19 LAB — COMPREHENSIVE METABOLIC PANEL
ALT: 22 U/L (ref 0–44)
AST: 35 U/L (ref 15–41)
Albumin: 4.4 g/dL (ref 3.5–5.0)
Alkaline Phosphatase: 52 U/L (ref 38–126)
Anion gap: 10 (ref 5–15)
BUN: 13 mg/dL (ref 6–20)
CO2: 25 mmol/L (ref 22–32)
Calcium: 9.6 mg/dL (ref 8.9–10.3)
Chloride: 102 mmol/L (ref 98–111)
Creatinine, Ser: 1.22 mg/dL (ref 0.61–1.24)
GFR, Estimated: >60 mL/min (ref >60)
Glucose, Bld: 92 mg/dL (ref 70–99)
Potassium: 4.6 mmol/L (ref 3.5–5.1)
Sodium: 137 mmol/L (ref 135–145)
Total Bilirubin: 0.9 mg/dL (ref 0.3–1.2)
Total Protein: 6.7 g/dL (ref 6.5–8.1)

## 2021-11-19 LAB — URINALYSIS, ROUTINE W REFLEX MICROSCOPIC
Bacteria, UA: NONE SEEN
Bilirubin Urine: NEGATIVE
Glucose, UA: NEGATIVE mg/dL
Hgb urine dipstick: NEGATIVE
Ketones, ur: NEGATIVE mg/dL
Leukocytes,Ua: NEGATIVE
Nitrite: NEGATIVE
Protein, ur: NEGATIVE mg/dL
Specific Gravity, Urine: 1.014 (ref 1.005–1.030)
pH: 6 (ref 5.0–8.0)

## 2021-11-19 LAB — RESP PANEL BY RT-PCR (FLU A&B, COVID) ARPGX2
Influenza A by PCR: NEGATIVE
Influenza B by PCR: NEGATIVE
SARS Coronavirus 2 by RT PCR: POSITIVE — AB

## 2021-11-19 LAB — CK
Total CK: 452 U/L — ABNORMAL HIGH (ref 49–397)
Total CK: 354 U/L (ref 49–397)
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
## 2021-11-19 NOTE — ED Triage Notes (Signed)
Pt arrives to ED with c/o muscle cramps and spasms over the past two weeks. 

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
## 2021-11-19 NOTE — ED Notes (Signed)
Pt left. 

## 2021-11-19 NOTE — ED Triage Notes (Signed)
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 Pt denies hematuria.

## 2021-11-19 NOTE — ED Notes (Signed)
Pt has left.  ?

## 2021-11-19 NOTE — Discharge Instructions (Signed)
Take tylenol 2 pills 4 times a day and motrin 4 pills 3 times a day.  Drink plenty of fluids.  Return for worsening shortness of breath, headache, confusion. Follow up with your family doctor.   

## 2022-11-27 ENCOUNTER — Encounter (HOSPITAL_COMMUNITY): Payer: Self-pay

## 2022-11-27 ENCOUNTER — Ambulatory Visit (HOSPITAL_COMMUNITY)
Admission: EM | Admit: 2022-11-27 | Discharge: 2022-11-27 | Disposition: A | Discharge status: Home / Self Care | Payer: Self-pay | Attending: Emergency Medicine | Admitting: Emergency Medicine

## 2022-11-27 DIAGNOSIS — K047 Periapical abscess without sinus: Secondary | ICD-10-CM

## 2022-11-27 DIAGNOSIS — K029 Dental caries, unspecified: Secondary | ICD-10-CM

## 2022-11-27 MED ORDER — HYDROCODONE-ACETAMINOPHEN 5-325 MG PO TABS: 1.0000 | ORAL_TABLET | Freq: Four times a day (QID) | ORAL | 0 refills | Status: AC | PRN

## 2022-11-27 MED ORDER — NAPROXEN 500 MG PO TABS: 500.0000 mg | ORAL_TABLET | Freq: Two times a day (BID) | ORAL | 0 refills | Status: AC

## 2022-11-27 MED ORDER — AMOXICILLIN-POT CLAVULANATE 875-125 MG PO TABS: 1.0000 | ORAL_TABLET | Freq: Two times a day (BID) | ORAL | 0 refills | Status: AC

## 2022-11-27 MED ORDER — LIDOCAINE VISCOUS HCL 2 % MT SOLN: 10.0000 mL | Freq: Four times a day (QID) | OROMUCOSAL | 0 refills | Status: AC | PRN

## 2022-11-27 NOTE — Discharge Instructions (Signed)
Finish the Augmentin, even if you feel better.  Continue salt water rinses.  Viscous lidocaine will help numb your tooth.  Take the Naprosyn with 1000 mg of regular Tylenol for mild to moderate pain twice a day.  If you are having severe pain, take the Naprosyn with 1-2 Norco.  You may take an additional time of a Tylenol containing product 1-2 more times per day.  Do not take the Norco and Tylenol as they both have Tylenol in them and too much Tylenol can hurt your liver.  Do not exceed 4000 mg of Tylenol in all sources in 24 hours.  I would not take the Naprosyn for 24 hours in order to give your stomach and kidneys a break.   Look up the [facility] [employer]'s Missions of [facility] for free dental clinics. https://www.williams-garcia.biz/.asp  Get there early and be prepared to wait. [employer]al[employer] Guile and [employer] have Armed forces operational officer schools that provide low cost routine dental care.   [facility] [address] [place], [place], [place]?  [phone]   Other resources: [facility] [address] [place], [place] [phone]  Patients with Medicaid: [facility] [phone] [address]                                [phone] [address] Phone:  [phone]                                                  Phone:  [phone]  Dr. Renee Rival [address]. [phone]    [facility] [phone] extension [phone] [address]   [place] [phone] [address] [place].  Rescue mission [phone] extension [address], [place], [place], [address] First come first serve for the first 10 clients.  May do simple extractions only, no wisdom teeth or surgery.  You may try the second for Thursday of the month starting at 6:30 AM.  [facility] of Dentistry You may call the school to see if they are still helping to provide dental care for emergent cases.  If unable to pay or  uninsured, contact:  [facility] or [facility]. to become qualified for the adult dental clinic.  No matter what dental problem you have, it will not get better unless you get good dental care.  If the tooth is not taken care of, your symptoms will come back in time and you will be visiting [place] again in the Urgent Care Center with a bad toothache.  So, see your dentist as soon as possible.  If you don't have a dentist, we can give you a list of dentists.  Sometimes the most cost effective treatment is removal of the tooth.  This can be done very inexpensively through one of the low cost Runner, broadcasting/film/video such as the facility on [facility] in [place] [phone]).  The downside to this is that you will have one less tooth and this can effect your ability to chew.  Some other things that can be done for a dental infection include the following:  Rinse your mouth out with  hot salt water (1/2 tsp of table salt and a pinch of baking soda in 8 oz of hot water).  You can do this every 2 or 3 hours. Avoid cold foods, beverages, and cold air.  This will make your symptoms worse. Sleep with your head elevated.  Sleeping flat will cause your gums and oral tissues to swell and make them hurt more.  You can sleep on several pillows.  Even better is to sleep in a recliner with your head higher than your heart. For mild to moderate pain, you can take Tylenol, ibuprofen, or Aleve. External application of heat by a heating pad, hot water bottle, or hot wet towel can help with pain and speed healing.  You can do this every 2 to 3 hours. Do not fall asleep on a heating pad since this can cause a burn.   Go to www.goodrx.com to look up your medications. This will give you a list of where you can find your prescriptions at the most affordable prices. Or ask the pharmacist what the cash price is, or if they have any other discount programs available to help make your medication more  affordable. This can be less expensive than what you would pay with insurance.

## 2022-11-27 NOTE — ED Provider Notes (Signed)
HPI  SUBJECTIVE:  Samuel Farrell is a 27 y.o. male who presents with 1 month of right upper and lower dull, achy, sore dental pain that has become worse, sharp and constant over the past week or 2.  He reports right-sided headaches, ear pain and states that his jaw is sore.  States that he has a hole in his upper wisdom tooth and that lower molar  is bothering him.  No recent trauma to these teeth.  No gingival swelling, fevers.  Intermittent facial swelling, resolved.  It is sensitive to air.  No swelling under the tongue, drooling, trismus.  He has been taking ibuprofen 800 mg 3 times daily, doing salt water rinses and drinking cold water.  The ibuprofen is no longer working.  Symptoms are worse with exposure to air, talking with biting down.  He has no past medical history.  Dentist: None    Past Medical History:  Diagnosis Date   ADHD (attention deficit hyperactivity disorder)    Asthma    Headache(784.0)     Past Surgical History:  Procedure Laterality Date   MOUTH SURGERY      Family History  Problem Relation Age of Onset   Migraines Mother 63   ADD / ADHD Mother    Learning disabilities Mother    ADD / ADHD Father    Learning disabilities Father    ADD / ADHD Sister    Learning disabilities Sister    Migraines Maternal Grandmother        Adult   Other Maternal Grandmother        Organ Failure died at 38   Pancreatic cancer Maternal Grandfather        Died at 72   Migraines Paternal Grandfather        Adult Onset   Dementia Paternal Grandfather        Age at time of death unknown   Cancer Paternal Grandmother        Age at time of death unknown    Social History   Tobacco Use   Smokeless tobacco: Never  Vaping Use   Vaping status: Never Used  Substance Use Topics   Alcohol use: No   Drug use: Yes    Frequency: 3.0 times per week    Types: Marijuana    No current facility-administered medications for this encounter.  Current Outpatient Medications:     amoxicillin-clavulanate (AUGMENTIN) 875-125 MG tablet, Take 1 tablet by mouth every 12 (twelve) hours., Disp: 14 tablet, Rfl: 0   HYDROcodone-acetaminophen (NORCO/VICODIN) 5-325 MG tablet, Take 1-2 tablets by mouth every 6 (six) hours as needed for moderate pain or severe pain., Disp: 12 tablet, Rfl: 0   lidocaine (XYLOCAINE) 2 % solution, Use as directed 10 mLs in the mouth or throat every 6 (six) hours as needed for mouth pain. Hold in mouth and spit. Do not swallow., Disp: 100 mL, Rfl: 0   naproxen (NAPROSYN) 500 MG tablet, Take 1 tablet (500 mg total) by mouth 2 (two) times daily., Disp: 20 tablet, Rfl: 0   mirtazapine (REMERON) 7.5 MG tablet, Take 7.5 mg by mouth at bedtime as needed (sleep)., Disp: , Rfl:   Allergies  Allergen Reactions   Penicillins Other (See Comments)    Mother doesn't want the patient to have penicillins because his father's side of the family is highly allergic. Has patient had a PCN reaction causing immediate rash, facial/tongue/throat swelling, SOB or lightheadedness with hypotension: NO Has patient had a PCN reaction causing  severe rash involving mucus membranes or skin necrosis: NO Has patient had a PCN reaction that required hospitalization NO Has patient had a PCN reaction occurring within the last 10 years: NO If all of the above answers ar     ROS  As noted in HPI.   Physical Exam  BP 133/82 (BP Location: Right Arm)   Pulse (!) 58   Temp (!) 97.2 F (36.2 C) (Oral)   Resp 16   SpO2 98%   Constitutional: Well developed, well nourished, no acute distress appears to be in a moderate amount of pain Eyes:  EOMI, conjunctiva normal bilaterally HENT: Normocephalic, atraumatic,mucus membranes moist.  Tooth #30 right lower first molar with a crown, tender to palpation.  Gums tender to palpation.  No gingival swelling or expressible purulent drainage. Tooth #1 right upper third molar cracked, tender to palpation no gingival swelling, expressible purulent  drainage.  No facial swelling, drooling, trismus, swelling underneath the tongue. Neck: No cervical lymphadenopathy Respiratory: Normal inspiratory effort Cardiovascular: Normal rate GI: nondistended skin: No rash, skin intact Musculoskeletal: no deformities Neurologic: Alert & oriented x 3, no focal neuro deficits Psychiatric: Speech and behavior appropriate   ED Course   Medications - No data to display  No orders of the defined types were placed in this encounter.   No results found for this or any previous visit (from the past 24 hour(s)). No results found.  ED Clinical Impression  1. Pain due to dental caries   2. Infected dental caries      ED Assessment/Plan     Patient declined dental block.  Patient presents with dental pain, secondary to dental infection.  Will send home on Augmentin.  Patient has never tried amoxicillin.  Discussed the alternative clindamycin, but parent is concerned about the possibility C. difficile so is willing to try the Augmentin.  Will send home with viscous lidocaine, Naprosyn/Tylenol containing product twice a day.  May take an additional dose of Tylenol containing product 1 more time per day.  Advised no Naprosyn for the next 24 hours.  1000 mg plain Tylenol for mild to moderate pain.  Norco for severe pain.  Providing dental list.  [place] Narcotic database reviewed for this patient, and feel that the risk/benefit ratio today is favorable for proceeding with a prescription for controlled substance.  No opiate prescriptions in the past 2 years.  Discussed MDM, treatment plan, and plan for follow-up with patient. patient agrees with plan.   Meds ordered this encounter  Medications   HYDROcodone-acetaminophen (NORCO/VICODIN) 5-325 MG tablet    Sig: Take 1-2 tablets by mouth every 6 (six) hours as needed for moderate pain or severe pain.    Dispense:  12 tablet    Refill:  0   lidocaine (XYLOCAINE) 2 % solution    Sig: Use as directed 10  mLs in the mouth or throat every 6 (six) hours as needed for mouth pain. Hold in mouth and spit. Do not swallow.    Dispense:  100 mL    Refill:  0   amoxicillin-clavulanate (AUGMENTIN) 875-125 MG tablet    Sig: Take 1 tablet by mouth every 12 (twelve) hours.    Dispense:  14 tablet    Refill:  0   naproxen (NAPROSYN) 500 MG tablet    Sig: Take 1 tablet (500 mg total) by mouth 2 (two) times daily.    Dispense:  20 tablet    Refill:  0      *This  clinic note was created using Scientist, clinical (histocompatibility and immunogenetics). Therefore, there may be occasional mistakes despite careful proofreading.  ?    Domenick Gong, MD 11/28/22 1243

## 2022-11-27 NOTE — ED Triage Notes (Signed)
Patient c/o right upper and lower dental pain x 2 weeks and worse in he past few days. Patient states the pain radiates into his right ear and head.  Patient states he has taken Ibuprofen and the last dose was at 0400.

## 2023-12-25 ENCOUNTER — Ambulatory Visit (HOSPITAL_COMMUNITY)
Admission: EM | Admit: 2023-12-25 | Discharge: 2023-12-25 | Disposition: A | Discharge status: Home / Self Care | Attending: Emergency Medicine | Admitting: Emergency Medicine

## 2023-12-25 ENCOUNTER — Encounter (HOSPITAL_COMMUNITY): Payer: Self-pay | Admitting: Emergency Medicine

## 2023-12-25 DIAGNOSIS — Z113 Encounter for screening for infections with a predominantly sexual mode of transmission: Secondary | ICD-10-CM | POA: Diagnosis not present

## 2023-12-25 LAB — HIV ANTIBODY (ROUTINE TESTING W REFLEX): HIV Screen 4th Generation wRfx: NONREACTIVE

## 2023-12-25 NOTE — ED Provider Notes (Signed)
 [facility]    CSN: [id number] Arrival date & time: 12/25/23  0919      History   Chief Complaint Chief Complaint  Patient presents with   SEXUALLY TRANSMITTED DISEASE    HPI Samuel Farrell is a 28 y.o. male.   Patient presents with an STD testing.  Patient states he has recently had unprotected sexual intercourse with multiple women over the last few weeks.  Patient denies any known exposures to STDs.  Patient denies any penile discharge, penile/testicular pain or swelling, genital lesions, dysuria, hematuria, urinary emergency/urgency, abdominal pain, flank pain, and fever.  The history is provided by the patient and medical records.    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 Authorizing Provider  amoxicillin -clavulanate (AUGMENTIN ) 875-125 MG tablet Take 1 tablet by mouth every 12 (twelve) hours. 11/27/22   Van Knee, MD  HYDROcodone -acetaminophen  (NORCO/VICODIN) 5-325 MG tablet Take 1-2 tablets by mouth every 6 (six) hours as needed for moderate pain or severe pain. 11/27/22   Van Knee, MD  lidocaine  (XYLOCAINE ) 2 % solution Use as directed 10 mLs in the mouth or throat every 6 (six) hours as needed for mouth pain. Hold in mouth and spit. Do not swallow. 11/27/22   Van Knee, MD  mirtazapine  (REMERON ) 7.5 MG tablet Take 7.5 mg by mouth at bedtime as needed (sleep).    [provider]  naproxen  (NAPROSYN ) 500 MG tablet Take 1 tablet (500 mg total) by mouth 2 (two) times daily. 11/27/22   Van Knee, MD  fluticasone  (FLONASE ) 50 MCG/ACT nasal spray Place 1 spray into both nostrils daily. Patient not taking: Reported on 06/09/2018 02/17/18 10/03/18  Leotis Sole, PA-C    Family History Family History  Problem Relation Age of Onset   Migraines Mother 90   ADD / ADHD Mother    Learning disabilities Mother    ADD / ADHD Father    Learning disabilities Father    ADD / ADHD Sister    Learning disabilities Sister    Migraines Maternal Grandmother        Adult   Other Maternal Grandmother        Organ Failure died at 9   Pancreatic cancer Maternal Grandfather        Died at 60   Migraines Paternal Grandfather        Adult Onset   Dementia Paternal Grandfather        Age at time of death unknown   Cancer Paternal Grandmother        Age at time of death unknown    Social History Social History   Tobacco Use   Smokeless tobacco: Never  Vaping Use   Vaping status: Never Used  Substance Use Topics   Alcohol use: No   Drug use: Yes    Frequency: 3.0 times per week    Types: Marijuana  Allergies   Patient has no active allergies.   Review of Systems Review of Systems  Per HPI  Physical Exam Triage Vital Signs ED Triage Vitals  Encounter Vitals Group     BP 12/25/23 0948 132/82     Girls Systolic BP Percentile --      Girls Diastolic BP Percentile --      Boys Systolic BP Percentile --      Boys Diastolic BP Percentile --      Pulse Rate 12/25/23 0948 79     Resp 12/25/23 0948 13     Temp 12/25/23 0948 98.1 F (36.7 C)     Temp Source 12/25/23 0948 Oral     SpO2 12/25/23 0948 98 %     Weight --      Height --      Head Circumference --      Peak Flow --      Pain Score 12/25/23 0947 0     Pain Loc --      Pain Education --      Exclude from Growth Chart --    No data found.  Updated Vital Signs BP 132/82 (BP Location: Left  Arm)   Pulse 79   Temp 98.1 F (36.7 C) (Oral)   Resp 13   SpO2 98%   Visual Acuity Right Eye Distance:   Left Eye Distance:   Bilateral Distance:    Right Eye Near:   Left Eye Near:    Bilateral Near:     Physical Exam Vitals and nursing note reviewed.  Constitutional:      General: He is awake. He is not in acute distress.    Appearance: Normal appearance. He is well-developed and well-groomed. He is not ill-appearing.  Genitourinary:    Comments: Exam deferred Neurological:     Mental Status: He is alert.  Psychiatric:        Behavior: Behavior is cooperative.      UC Treatments / Results  Labs (all labs ordered are listed, but only abnormal results are displayed) Labs Reviewed  HIV ANTIBODY (ROUTINE TESTING W REFLEX)  RPR  CYTOLOGY, (ORAL, ANAL, URETHRAL) ANCILLARY ONLY    EKG   Radiology No results found.  Procedures Procedures (including critical care time)  Medications Ordered in UC Medications - No data to display  Initial Impression / Assessment and Plan / UC Course  I have reviewed the triage vital signs and the nursing notes.  Pertinent labs & imaging results that were available during my care of the patient were reviewed by me and considered in my medical decision making (see chart for details).     Patient is overall well-appearing.  Vitals are stable.  GU exam deferred.  Patient performed self swab for STD.  HIV and RPR ordered.  Discussed follow-up and return precautions. Final Clinical Impressions(s) / UC Diagnoses   Final diagnoses:  Screen for STD (sexually transmitted disease)     Discharge Instructions      Your results will come back over the next few days and someone will call if results are positive and require treatment.   Your results will also be available to you on your MyChart as well.  Return here as needed.    ED Prescriptions   None    PDMP not reviewed this encounter.   Johnie Flaming A, NP 12/25/23  1001

## 2023-12-25 NOTE — ED Triage Notes (Signed)
 Pt reports been having a lot of unprotected sex and wants to be sure doesn't have any STD. Denies any s/s or known exposure.

## 2023-12-25 NOTE — Discharge Instructions (Addendum)
 Your results will come back over the next few days and someone will call if results are positive and require treatment.   Your results will also be available to you on your MyChart as well.  Return here as needed.

## 2023-12-26 LAB — CYTOLOGY, (ORAL, ANAL, URETHRAL) ANCILLARY ONLY
Chlamydia: NEGATIVE
Comment: NEGATIVE
Comment: NEGATIVE
Comment: NORMAL
Neisseria Gonorrhea: NEGATIVE
Trichomonas: NEGATIVE

## 2023-12-26 LAB — RPR: RPR Ser Ql: NONREACTIVE
